# Patient Record
Sex: Female | Born: 1995 | Race: Black or African American | Hispanic: No | Marital: Single | Smoking: Never smoker
Health system: Southern US, Community
[De-identification: ages and names within clinical notes are randomized; demographics above are authoritative.]

## PROBLEM LIST (undated history)

## (undated) DIAGNOSIS — F419 Anxiety disorder, unspecified: Secondary | ICD-10-CM

## (undated) DIAGNOSIS — Z973 Presence of spectacles and contact lenses: Secondary | ICD-10-CM

## (undated) DIAGNOSIS — L659 Nonscarring hair loss, unspecified: Secondary | ICD-10-CM

## (undated) DIAGNOSIS — F32A Depression, unspecified: Secondary | ICD-10-CM

## (undated) DIAGNOSIS — F329 Major depressive disorder, single episode, unspecified: Secondary | ICD-10-CM

## (undated) HISTORY — PX: NO PAST SURGERIES: SHX2092

---
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
## 2017-02-19 IMAGING — CR DG CHEST 2V
2 series · 2 of 2 positions shown · non-contrast
Comparison: None.

CLINICAL DATA: 19-year-old female with palpitations and panic
attack.

EXAM:
CHEST  2 VIEW

[chest pa]
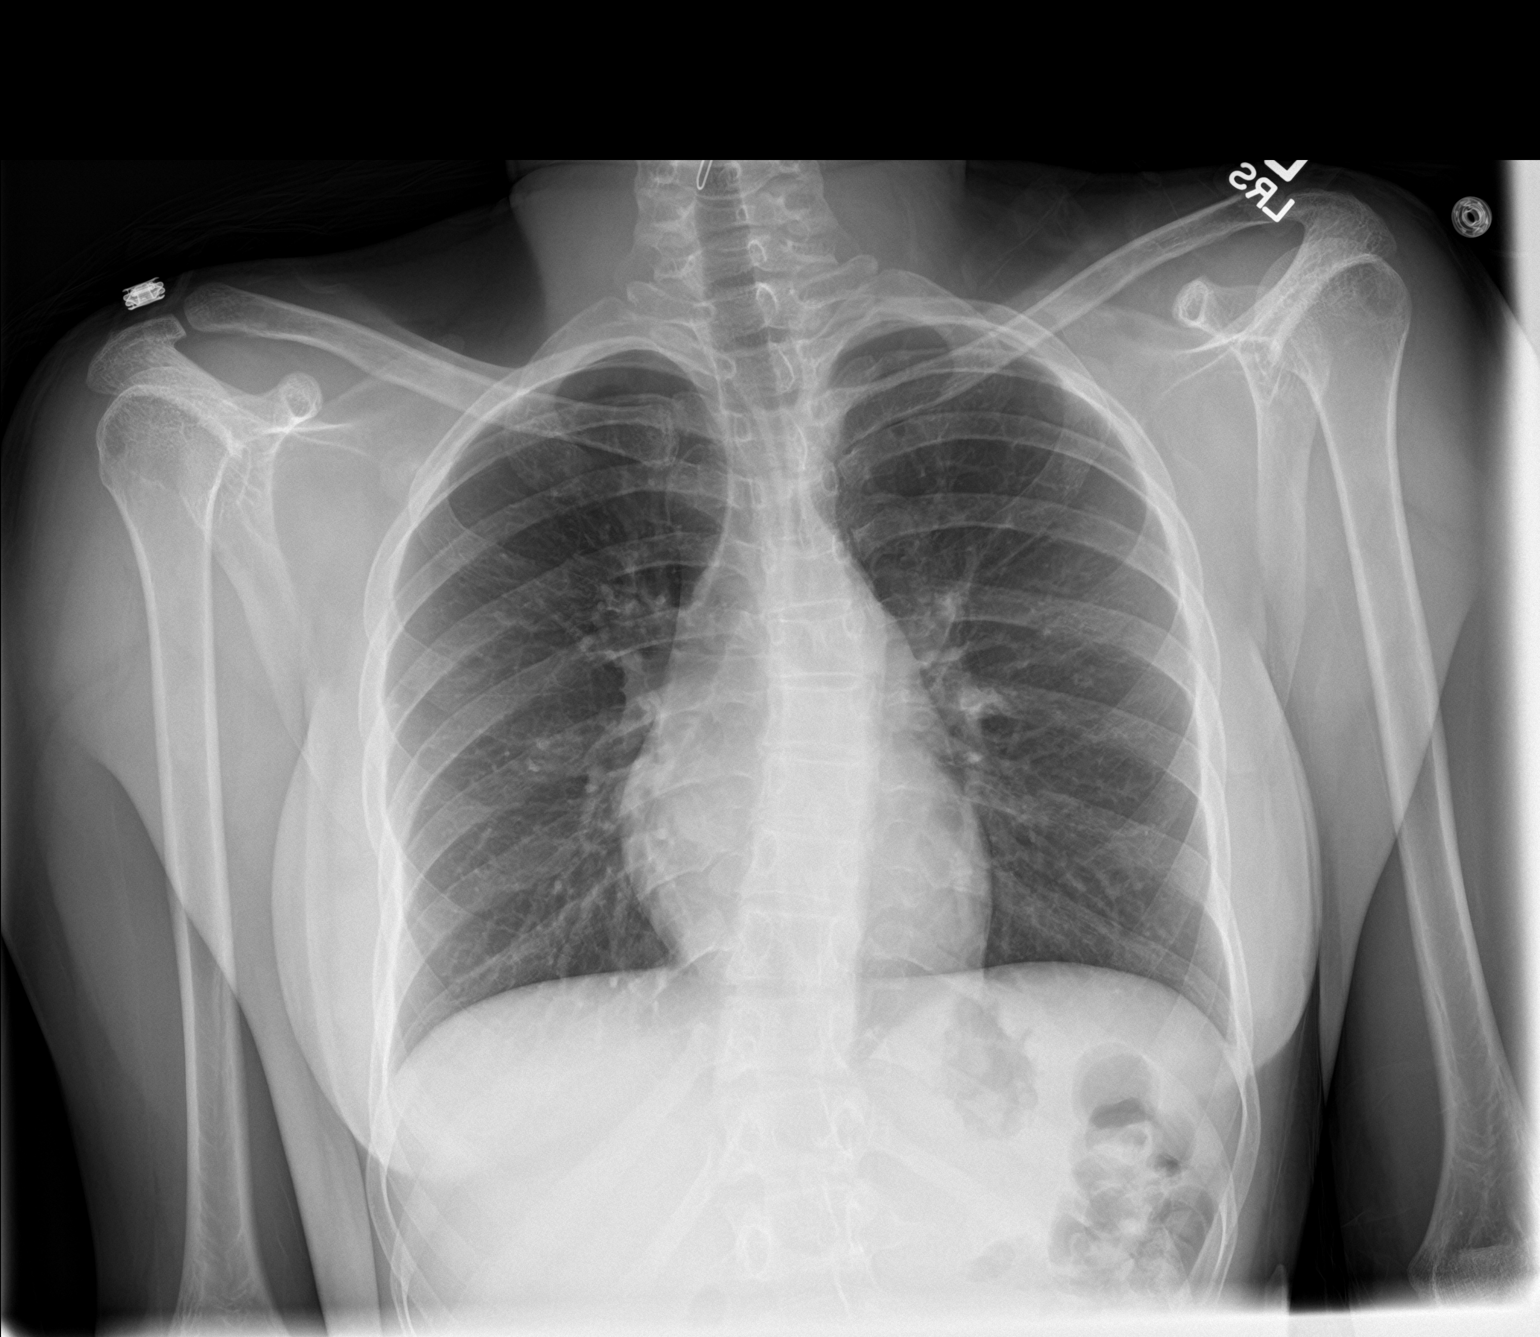

[chest lat]
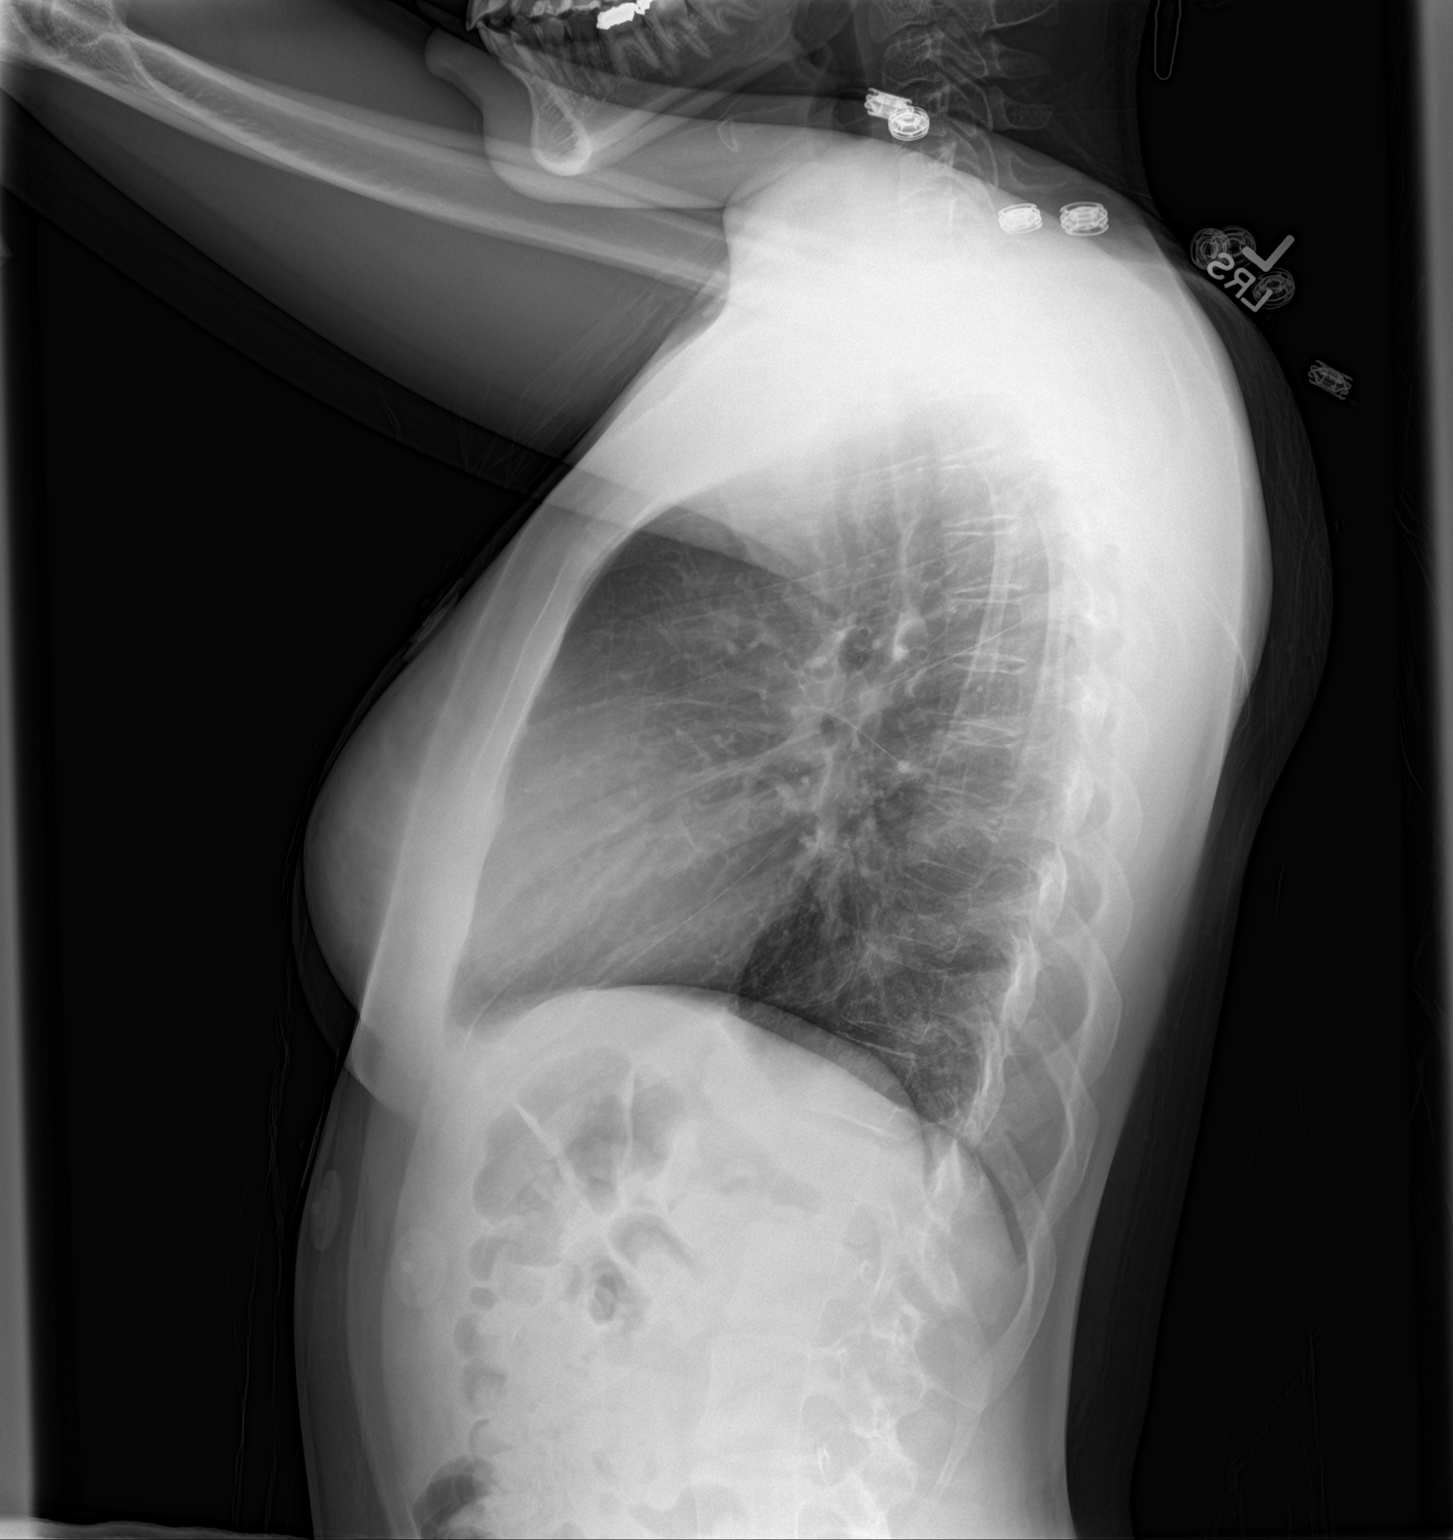

[2 of 2 positions shown; findings below may reference images not displayed]

FINDINGS: The heart size and mediastinal contours are within normal limits.
Both lungs are clear. The visualized skeletal structures are
unremarkable.
IMPRESSION: No active cardiopulmonary disease.

## 2019-07-05 ENCOUNTER — Encounter (HOSPITAL_COMMUNITY): Payer: Self-pay

## 2019-07-05 ENCOUNTER — Ambulatory Visit (HOSPITAL_COMMUNITY)
Admission: EM | Admit: 2019-07-05 | Discharge: 2019-07-05 | Disposition: A | Discharge status: Home / Self Care | Payer: HRSA Program | Attending: Urgent Care | Admitting: Urgent Care

## 2019-07-05 ENCOUNTER — Other Ambulatory Visit: Payer: Self-pay

## 2019-07-05 DIAGNOSIS — J069 Acute upper respiratory infection, unspecified: Secondary | ICD-10-CM | POA: Diagnosis present

## 2019-07-05 DIAGNOSIS — Z20822 Contact with and (suspected) exposure to covid-19: Secondary | ICD-10-CM

## 2019-07-05 DIAGNOSIS — U071 COVID-19: Secondary | ICD-10-CM | POA: Insufficient documentation

## 2019-07-05 MED ORDER — CETIRIZINE HCL 10 MG PO TABS: 10.0000 mg | ORAL_TABLET | Freq: Every day | ORAL | 0 refills | Status: DC

## 2019-07-05 MED ORDER — ONDANSETRON HCL 4 MG PO TABS: 4.0000 mg | ORAL_TABLET | Freq: Three times a day (TID) | ORAL | 0 refills | Status: DC | PRN

## 2019-07-05 MED ORDER — ACETAMINOPHEN 500 MG PO TABS: 500.0000 mg | ORAL_TABLET | Freq: Three times a day (TID) | ORAL | 0 refills | Status: DC | PRN

## 2019-07-05 NOTE — Discharge Instructions (Addendum)
Take the zyrtec Take the tylenol bodyache Take the zofran for nausea   If your Covid-19 test is positive, you will receive a phone call from [facility] regarding your results. Negative test results are not called. Both positive and negative results area always visible on MyChart. If you do not have a MyChart account, sign up instructions are in your discharge papers.   Persons who are directed to care for themselves at home may discontinue isolation under the following conditions:   At least 10 days have passed since symptom onset and  At least 24 hours have passed without running a fever (this means without the use of fever-reducing medications) and  Other symptoms have improved.  Persons infected with COVID-19 who never develop symptoms may discontinue isolation and other precautions 10 days after the date of their first positive COVID-19 test.

## 2019-07-05 NOTE — ED Triage Notes (Signed)
Pt presents to UC diarrhea, headache, chills, fever, body aches x 1 day.

## 2019-07-05 NOTE — ED Provider Notes (Signed)
[facility]    CSN: [id number] Arrival date & time: 07/05/19  1116      History   Chief Complaint Chief Complaint  Patient presents with  . Diarrhea  . Headache  . Fever  . Chills  . Generalized Body Aches    HPI Yvonne Carlson is a 24 y.o. female.   Patient reports urgent care for 1 day history of body ache, fever, chills and diarrhea.  She reports 3-4 loose stools since yesterday.  There has been no blood in her stools.  She is also endorsing nausea however denies vomiting.  She does have decreased appetite.  She has been tolerating liquids.  She has had subjective fever body ache and chills.  She also reports nasal congestion and a slight cough.  She also reports a mild sore throat.  Denies any ear pain.  Denies any shortness of breath or chest pain.  She is also reporting a frontal headache since the onset of symptoms.  Denies change in taste or smell.  Denies any painful urination, frequency urination or urgency urination.   Denies known sick contacts.      Past Medical History:  Diagnosis Date  . Anxiety   . Depression     There are no problems to display for this patient.   Past Surgical History:  Procedure Laterality Date  . NO PAST SURGERIES      OB History    Gravida  0   Para  0   Term  0   Preterm  0   AB  0   Living  0     SAB  0   TAB  0   Ectopic  0   Multiple  0   Live Births               Home Medications    Prior to Admission medications   Medication Sig Start Date End Date Taking? Authorizing Provider  acetaminophen (TYLENOL) 500 MG tablet Take 1 tablet (500 mg total) by mouth every 8 (eight) hours as needed for mild pain or headache. 07/05/19   Mickala Laton, Veryl Speak, PA-C  calcium carbonate (TUMS - DOSED IN MG ELEMENTAL CALCIUM) 500 MG chewable tablet Chew 2 tablets by mouth daily.    [provider]  cetirizine (ZYRTEC ALLERGY) 10 MG tablet Take 1 tablet (10 mg total) by mouth daily. 07/05/19 08/04/19  Katilin Raynes,  Veryl Speak, PA-C  ibuprofen (ADVIL,MOTRIN) 400 MG tablet Take 1 tablet (400 mg total) by mouth every 6 (six) hours as needed. 03/06/15   Antony Madura, PA-C  metoCLOPramide (REGLAN) 10 MG tablet Take 1 tablet (10 mg total) by mouth every 8 (eight) hours as needed (headache). 03/20/15   Tomasita Crumble, MD  omeprazole (PRILOSEC) 20 MG capsule Take 1 capsule (20 mg total) by mouth daily. 01/31/15   Marny Lowenstein, PA-C  ondansetron (ZOFRAN) 4 MG tablet Take 1 tablet (4 mg total) by mouth every 8 (eight) hours as needed for nausea or vomiting. 07/05/19   Ellisa Devivo, Veryl Speak, PA-C    Family History Family History  Problem Relation Age of Onset  . Hypertension Maternal Grandmother   . Diabetes Maternal Grandmother     Social History Social History   Tobacco Use  . Smoking status: Never Smoker  . Smokeless tobacco: Never Used  Substance Use Topics  . Alcohol use: No  . Drug use: No     Allergies   Patient has no known allergies.  Review of Systems Review of Systems  See HPI Physical Exam Triage Vital Signs ED Triage Vitals  Enc Vitals Group     BP 07/05/19 1148 119/70     Pulse Rate 07/05/19 1148 79     Resp 07/05/19 1148 17     Temp 07/05/19 1148 99.6 F (37.6 C)     Temp Source 07/05/19 1148 Oral     SpO2 07/05/19 1148 100 %     Weight --      Height --      Head Circumference --      Peak Flow --      Pain Score 07/05/19 1146 10     Pain Loc --      Pain Edu? --      Excl. in [place]? --    No data found.  Updated Vital Signs BP 119/70 (BP Location: Left Arm)   Pulse 79   Temp 99.6 F (37.6 C) (Oral)   Resp 17   LMP  (Within Months) Comment: 1 month  SpO2 100%   Visual Acuity Right Eye Distance:   Left Eye Distance:   Bilateral Distance:    Right Eye Near:   Left Eye Near:    Bilateral Near:     Physical Exam Vitals and nursing note reviewed.  Constitutional:      General: She is not in acute distress.    Appearance: She is well-developed. She is ill-appearing.    HENT:     Head: Normocephalic and atraumatic.     Nose:     Comments: Erythematous and swollen bilaterally.  There is some clear nasal discharge    Mouth/Throat:     Mouth: Mucous membranes are moist.     Pharynx: Oropharynx is clear.  Eyes:     Extraocular Movements: Extraocular movements intact.     Conjunctiva/sclera: Conjunctivae normal.     Pupils: Pupils are equal, round, and reactive to light.  Cardiovascular:     Rate and Rhythm: Normal rate and regular rhythm.     Heart sounds: No murmur.  Pulmonary:     Effort: Pulmonary effort is normal. No respiratory distress.     Breath sounds: Normal breath sounds.  Abdominal:     Palpations: Abdomen is soft.     Tenderness: There is no abdominal tenderness.  Musculoskeletal:     Cervical back: Neck supple.  Skin:    General: Skin is warm and dry.  Neurological:     Mental Status: She is alert.      UC Treatments / Results  Labs (all labs ordered are listed, but only abnormal results are displayed) Labs Reviewed  SARS CORONAVIRUS 2 (TAT 6-24 HRS) - Abnormal; Notable for the following components:      Result Value   SARS Coronavirus 2 POSITIVE (*)    All other components within normal limits    EKG   Radiology No results found.  Procedures Procedures (including critical care time)  Medications Ordered in UC Medications - No data to display  Initial Impression / Assessment and Plan / UC Course  I have reviewed the triage vital signs and the nursing notes.  Pertinent labs & imaging results that were available during my care of the patient were reviewed by me and considered in my medical decision making (see chart for details).     #Viral illness- COVID Patient 24 year old presenting with viral illness with upper respiratory and GI symptoms.  Covid PCR did return prior to note filing  and this was positive.  Patient is afebrile saturating well and hemodynamically well.  Will discharge with Zofran and  recommendation of Tylenol for headache and body ache these seem to be the biggest complaints.  Follow-up and return precautions were discussed with patient.  Final Clinical Impressions(s) / UC Diagnoses   Final diagnoses:  Viral upper respiratory tract infection  Encounter for laboratory testing for COVID-19 virus     Discharge Instructions     Take the zyrtec Take the tylenol bodyache Take the zofran for nausea   If your Covid-19 test is positive, you will receive a phone call from [facility] regarding your results. Negative test results are not called. Both positive and negative results area always visible on MyChart. If you do not have a MyChart account, sign up instructions are in your discharge papers.   Persons who are directed to care for themselves at home may discontinue isolation under the following conditions:  . At least 10 days have passed since symptom onset and . At least 24 hours have passed without running a fever (this means without the use of fever-reducing medications) and . Other symptoms have improved.  Persons infected with COVID-19 who never develop symptoms may discontinue isolation and other precautions 10 days after the date of their first positive COVID-19 test.       ED Prescriptions    Medication Sig Dispense Auth. Provider   cetirizine (ZYRTEC ALLERGY) 10 MG tablet Take 1 tablet (10 mg total) by mouth daily. 30 tablet Alhaji Mcneal, Veryl Speak, PA-C   acetaminophen (TYLENOL) 500 MG tablet Take 1 tablet (500 mg total) by mouth every 8 (eight) hours as needed for mild pain or headache. 30 tablet Antigone Crowell, Veryl Speak, PA-C   ondansetron (ZOFRAN) 4 MG tablet Take 1 tablet (4 mg total) by mouth every 8 (eight) hours as needed for nausea or vomiting. 4 tablet Tarae Wooden, Veryl Speak, PA-C     PDMP not reviewed this encounter.   Hermelinda Medicus, PA-C 07/06/19 [phone]

## 2019-07-06 LAB — SARS CORONAVIRUS 2 (TAT 6-24 HRS): SARS Coronavirus 2: POSITIVE — AB

## 2019-09-21 ENCOUNTER — Ambulatory Visit (HOSPITAL_COMMUNITY)
Admission: EM | Admit: 2019-09-21 | Discharge: 2019-09-21 | Disposition: A | Discharge status: Home / Self Care | Payer: BC Managed Care – PPO | Attending: Emergency Medicine | Admitting: Emergency Medicine

## 2019-09-21 ENCOUNTER — Other Ambulatory Visit: Payer: Self-pay

## 2019-09-21 ENCOUNTER — Encounter (HOSPITAL_COMMUNITY): Payer: Self-pay

## 2019-09-21 DIAGNOSIS — Z79899 Other long term (current) drug therapy: Secondary | ICD-10-CM | POA: Diagnosis not present

## 2019-09-21 DIAGNOSIS — N760 Acute vaginitis: Secondary | ICD-10-CM | POA: Insufficient documentation

## 2019-09-21 DIAGNOSIS — Z3202 Encounter for pregnancy test, result negative: Secondary | ICD-10-CM | POA: Diagnosis not present

## 2019-09-21 DIAGNOSIS — R350 Frequency of micturition: Secondary | ICD-10-CM | POA: Diagnosis not present

## 2019-09-21 LAB — POCT URINALYSIS DIP (DEVICE)
Bilirubin Urine: NEGATIVE
Glucose, UA: NEGATIVE mg/dL
Hgb urine dipstick: NEGATIVE
Ketones, ur: NEGATIVE mg/dL
Leukocytes,Ua: NEGATIVE
Nitrite: NEGATIVE
Protein, ur: NEGATIVE mg/dL
Specific Gravity, Urine: 1.025 (ref 1.005–1.030)
Urobilinogen, UA: 0.2 mg/dL (ref 0.0–1.0)
pH: 7 (ref 5.0–8.0)

## 2019-09-21 LAB — POC URINE PREG, ED: Preg Test, Ur: NEGATIVE

## 2019-09-21 MED ORDER — FLUCONAZOLE 150 MG PO TABS: 150.0000 mg | ORAL_TABLET | Freq: Once | ORAL | 0 refills | Status: AC

## 2019-09-21 NOTE — Discharge Instructions (Signed)
Please take 1 tablet of Diflucan today, repeat in 3 to 4 days if positive for yeast and still having symptoms  We are testing you for Gonorrhea, Chlamydia, Trichomonas, Yeast and Bacterial Vaginosis. We will call you if anything is positive and let you know if you require any further treatment. Please inform partners of any positive results.   Please return if symptoms not improving with treatment, development of fever, nausea, vomiting, abdominal pain.

## 2019-09-21 NOTE — ED Triage Notes (Signed)
Pt presents with vaginal discharge and urinary frequency X 3 days.

## 2019-09-22 NOTE — ED Provider Notes (Signed)
[facility]    CSN: [id number] Arrival date & time: 09/21/19  1509      History   Chief Complaint Chief Complaint  Patient presents with  . Vaginitis    HPI Yvonne Carlson is a 24 y.o. female presenting today for evaluation of vaginal discharge.  Patient reports that over the past 3 days she has had vaginal discharge, urinary frequency along with some dysuria and incomplete voiding.  She has had some irritation and burning externally and feels as if her labia is swollen.  She does report recently taking metronidazole which she had leftover from a prior BV prescription in hopes that this would treat chlamydia as she had possible exposure to chlamydia.  She denies any fevers nausea or vomiting.  Last menstrual cycle was middle of May, is not on birth control.  HPI  Past Medical History:  Diagnosis Date  . Anxiety   . Depression     There are no problems to display for this patient.   Past Surgical History:  Procedure Laterality Date  . NO PAST SURGERIES      OB History    Gravida  0   Para  0   Term  0   Preterm  0   AB  0   Living  0     SAB  0   TAB  0   Ectopic  0   Multiple  0   Live Births               Home Medications    Prior to Admission medications   Medication Sig Start Date End Date Taking? Authorizing Provider  acetaminophen (TYLENOL) 500 MG tablet Take 1 tablet (500 mg total) by mouth every 8 (eight) hours as needed for mild pain or headache. 07/05/19   Darr, Veryl Speak, PA-C  calcium carbonate (TUMS - DOSED IN MG ELEMENTAL CALCIUM) 500 MG chewable tablet Chew 2 tablets by mouth daily.    [provider]  cetirizine (ZYRTEC ALLERGY) 10 MG tablet Take 1 tablet (10 mg total) by mouth daily. 07/05/19 08/04/19  Darr, Veryl Speak, PA-C  ibuprofen (ADVIL,MOTRIN) 400 MG tablet Take 1 tablet (400 mg total) by mouth every 6 (six) hours as needed. 03/06/15   Antony Madura, PA-C  metoCLOPramide (REGLAN) 10 MG tablet Take 1 tablet (10 mg  total) by mouth every 8 (eight) hours as needed (headache). 03/20/15   Tomasita Crumble, MD  omeprazole (PRILOSEC) 20 MG capsule Take 1 capsule (20 mg total) by mouth daily. 01/31/15   Marny Lowenstein, PA-C  ondansetron (ZOFRAN) 4 MG tablet Take 1 tablet (4 mg total) by mouth every 8 (eight) hours as needed for nausea or vomiting. 07/05/19   Darr, Veryl Speak, PA-C    Family History Family History  Problem Relation Age of Onset  . Hypertension Maternal Grandmother   . Diabetes Maternal Grandmother     Social History Social History   Tobacco Use  . Smoking status: Never Smoker  . Smokeless tobacco: Never Used  Substance Use Topics  . Alcohol use: No  . Drug use: No     Allergies   Patient has no known allergies.   Review of Systems Review of Systems  Constitutional: Negative for fever.  Respiratory: Negative for shortness of breath.   Cardiovascular: Negative for chest pain.  Gastrointestinal: Negative for abdominal pain, diarrhea, nausea and vomiting.  Genitourinary: Positive for dysuria, frequency and vaginal discharge. Negative for flank pain, genital sores, hematuria,  menstrual problem, vaginal bleeding and vaginal pain.  Musculoskeletal: Negative for back pain.  Skin: Negative for rash.  Neurological: Negative for dizziness, light-headedness and headaches.     Physical Exam Triage Vital Signs ED Triage Vitals  Enc Vitals Group     BP 09/21/19 1532 117/60     Pulse Rate 09/21/19 1532 83     Resp 09/21/19 1532 18     Temp 09/21/19 1532 98.6 F (37 C)     Temp Source 09/21/19 1532 Oral     SpO2 09/21/19 1532 100 %     Weight --      Height --      Head Circumference --      Peak Flow --      Pain Score 09/21/19 1533 2     Pain Loc --      Pain Edu? --      Excl. in [place]? --    No data found.  Updated Vital Signs BP 117/60 (BP Location: Right Arm)   Pulse 83   Temp 98.6 F (37 C) (Oral)   Resp 18   LMP  (LMP Unknown)   SpO2 100%   Visual Acuity Right Eye  Distance:   Left Eye Distance:   Bilateral Distance:    Right Eye Near:   Left Eye Near:    Bilateral Near:     Physical Exam Vitals and nursing note reviewed.  Constitutional:      Appearance: She is well-developed.     Comments: No acute distress  HENT:     Head: Normocephalic and atraumatic.     Nose: Nose normal.  Eyes:     Conjunctiva/sclera: Conjunctivae normal.  Cardiovascular:     Rate and Rhythm: Normal rate.  Pulmonary:     Effort: Pulmonary effort is normal. No respiratory distress.  Abdominal:     General: There is no distension.     Comments: Soft, nondistended, nontender to light and to palpation throughout abdomen  Genitourinary:    Comments: Vulva appears more erythematous in various areas with small cut/open lesions, non or ulcerative and nonvesicular blister appearing Musculoskeletal:        General: Normal range of motion.     Cervical back: Neck supple.  Skin:    General: Skin is warm and dry.  Neurological:     Mental Status: She is alert and oriented to person, place, and time.      UC Treatments / Results  Labs (all labs ordered are listed, but only abnormal results are displayed) Labs Reviewed  POC URINE PREG, ED  POCT URINALYSIS DIP (DEVICE)  CERVICOVAGINAL ANCILLARY ONLY    EKG   Radiology No results found.  Procedures Procedures (including critical care time)  Medications Ordered in UC Medications - No data to display  Initial Impression / Assessment and Plan / UC Course  I have reviewed the triage vital signs and the nursing notes.  Pertinent labs & imaging results that were available during my care of the patient were reviewed by me and considered in my medical decision making (see chart for details).     Exam and history suggestive of most likely yeast given associated small cuts and started after taking antibiotics.  Empirically treating with Diflucan.  UA unremarkable with negative pregnancy.  Vaginal swab pending to  further evaluate for vaginitis/STDs.  Will call with results and alter treatment as needed.  Discussed strict return precautions. Patient verbalized understanding and is agreeable with plan.  Final  Clinical Impressions(s) / UC Diagnoses   Final diagnoses:  Vaginitis and vulvovaginitis     Discharge Instructions     Please take 1 tablet of Diflucan today, repeat in 3 to 4 days if positive for yeast and still having symptoms  We are testing you for Gonorrhea, Chlamydia, Trichomonas, Yeast and Bacterial Vaginosis. We will call you if anything is positive and let you know if you require any further treatment. Please inform partners of any positive results.   Please return if symptoms not improving with treatment, development of fever, nausea, vomiting, abdominal pain.    ED Prescriptions    Medication Sig Dispense Auth. Provider   fluconazole (DIFLUCAN) 150 MG tablet Take 1 tablet (150 mg total) by mouth once for 1 dose. 2 tablet Jeovani Weisenburger, [place] C, PA-C     PDMP not reviewed this encounter.   Medha Pippen, [place] C, PA-C 09/22/19 1150

## 2019-09-23 LAB — CERVICOVAGINAL ANCILLARY ONLY
Bacterial Vaginitis (gardnerella): NEGATIVE
Candida Glabrata: NEGATIVE
Candida Vaginitis: POSITIVE — AB
Chlamydia: NEGATIVE
Comment: NEGATIVE
Comment: NEGATIVE
Comment: NEGATIVE
Comment: NEGATIVE
Comment: NEGATIVE
Comment: NORMAL
Neisseria Gonorrhea: NEGATIVE
Trichomonas: NEGATIVE

## 2019-10-16 ENCOUNTER — Ambulatory Visit: Payer: Self-pay | Admitting: Dermatology

## 2020-11-11 ENCOUNTER — Ambulatory Visit (HOSPITAL_COMMUNITY)
Admission: EM | Admit: 2020-11-11 | Discharge: 2020-11-11 | Disposition: A | Discharge status: Home / Self Care | Payer: Self-pay | Attending: Emergency Medicine | Admitting: Emergency Medicine

## 2020-11-11 ENCOUNTER — Encounter (HOSPITAL_COMMUNITY): Payer: Self-pay

## 2020-11-11 ENCOUNTER — Other Ambulatory Visit: Payer: Self-pay

## 2020-11-11 DIAGNOSIS — Z7952 Long term (current) use of systemic steroids: Secondary | ICD-10-CM | POA: Insufficient documentation

## 2020-11-11 DIAGNOSIS — R0989 Other specified symptoms and signs involving the circulatory and respiratory systems: Secondary | ICD-10-CM | POA: Insufficient documentation

## 2020-11-11 DIAGNOSIS — R059 Cough, unspecified: Secondary | ICD-10-CM | POA: Insufficient documentation

## 2020-11-11 DIAGNOSIS — J3489 Other specified disorders of nose and nasal sinuses: Secondary | ICD-10-CM | POA: Insufficient documentation

## 2020-11-11 DIAGNOSIS — U071 COVID-19: Secondary | ICD-10-CM | POA: Insufficient documentation

## 2020-11-11 LAB — SARS CORONAVIRUS 2 (TAT 6-24 HRS): SARS Coronavirus 2: POSITIVE — AB

## 2020-11-11 MED ORDER — PREDNISONE 10 MG (21) PO TBPK
ORAL_TABLET | Freq: Every day | ORAL | 0 refills | Status: DC
Start: 2020-11-11 — End: 2023-09-06

## 2020-11-11 MED ORDER — ALBUTEROL SULFATE HFA 108 (90 BASE) MCG/ACT IN AERS: 1.0000 | INHALATION_SPRAY | Freq: Four times a day (QID) | RESPIRATORY_TRACT | 0 refills | Status: DC | PRN

## 2020-11-11 NOTE — ED Triage Notes (Signed)
Pt reports cough x 3 days.  

## 2020-11-11 NOTE — ED Provider Notes (Signed)
[facility]    CSN: [id number] Arrival date & time: 11/11/20  0856      History   Chief Complaint Chief Complaint  Patient presents with   Cough    HPI Yvonne Carlson is a 25 y.o. female.   Pt is here to have a covid test. She has a cough, runny nose, and bodyaches for the past 3 days. States she has not taken anything for this. Unsure she is has been around anyone else that was sick.    Past Medical History:  Diagnosis Date   Anxiety    Depression     There are no problems to display for this patient.   Past Surgical History:  Procedure Laterality Date   NO PAST SURGERIES      OB History     Gravida  0   Para  0   Term  0   Preterm  0   AB  0   Living  0      SAB  0   IAB  0   Ectopic  0   Multiple  0   Live Births               Home Medications    Prior to Admission medications   Medication Sig Start Date End Date Taking? Authorizing Provider  albuterol (VENTOLIN HFA) 108 (90 Base) MCG/ACT inhaler Inhale 1-2 puffs into the lungs every 6 (six) hours as needed for wheezing or shortness of breath. 11/11/20  Yes Coralyn Mark, NP  predniSONE (STERAPRED UNI-PAK 21 TAB) 10 MG (21) TBPK tablet Take by mouth daily. Take 6 tabs by mouth daily  for 2 days, then 5 tabs for 2 days, then 4 tabs for 2 days, then 3 tabs for 2 days, 2 tabs for 2 days, then 1 tab by mouth daily for 2 days 11/11/20  Yes Coralyn Mark, NP  acetaminophen (TYLENOL) 500 MG tablet Take 1 tablet (500 mg total) by mouth every 8 (eight) hours as needed for mild pain or headache. 07/05/19   Darr, Gerilyn Pilgrim, PA-C  calcium carbonate (TUMS - DOSED IN MG ELEMENTAL CALCIUM) 500 MG chewable tablet Chew 2 tablets by mouth daily.    [provider]  cetirizine (ZYRTEC ALLERGY) 10 MG tablet Take 1 tablet (10 mg total) by mouth daily. 07/05/19 08/04/19  Darr, Gerilyn Pilgrim, PA-C  ibuprofen (ADVIL,MOTRIN) 400 MG tablet Take 1 tablet (400 mg total) by mouth every 6 (six) hours as  needed. 03/06/15   Antony Madura, PA-C  metoCLOPramide (REGLAN) 10 MG tablet Take 1 tablet (10 mg total) by mouth every 8 (eight) hours as needed (headache). 03/20/15   Tomasita Crumble, MD  omeprazole (PRILOSEC) 20 MG capsule Take 1 capsule (20 mg total) by mouth daily. 01/31/15   Marny Lowenstein, PA-C  ondansetron (ZOFRAN) 4 MG tablet Take 1 tablet (4 mg total) by mouth every 8 (eight) hours as needed for nausea or vomiting. 07/05/19   Darr, Gerilyn Pilgrim, PA-C    Family History Family History  Problem Relation Age of Onset   Hypertension Maternal Grandmother    Diabetes Maternal Grandmother     Social History Social History   Tobacco Use   Smoking status: Never   Smokeless tobacco: Never  Substance Use Topics   Alcohol use: No   Drug use: No     Allergies   Patient has no known allergies.   Review of Systems Review of Systems  Constitutional:  Positive for activity  change.  HENT:  Positive for congestion, postnasal drip, rhinorrhea, sinus pain and sneezing.   Eyes: Negative.   Respiratory:  Positive for cough. Negative for shortness of breath.   Cardiovascular: Negative.   Gastrointestinal: Negative.   Genitourinary: Negative.   Skin: Negative.   Neurological: Negative.     Physical Exam Triage Vital Signs ED Triage Vitals  Enc Vitals Group     BP 11/11/20 0936 116/76     Pulse Rate 11/11/20 0936 85     Resp 11/11/20 0936 16     Temp 11/11/20 0936 98.3 F (36.8 C)     Temp Source 11/11/20 0936 Oral     SpO2 11/11/20 0936 98 %     Weight --      Height --      Head Circumference --      Peak Flow --      Pain Score 11/11/20 0935 0     Pain Loc --      Pain Edu? --      Excl. in [place]? --    No data found.  Updated Vital Signs BP 116/76 (BP Location: Left Arm)   Pulse 85   Temp 98.3 F (36.8 C) (Oral)   Resp 16   SpO2 98%   Visual Acuity Right Eye Distance:   Left Eye Distance:   Bilateral Distance:    Right Eye Near:   Left Eye Near:    Bilateral Near:      Physical Exam Constitutional:      Appearance: Normal appearance.  HENT:     Right Ear: Tympanic membrane normal.     Left Ear: Tympanic membrane normal.     Nose: Congestion present.     Mouth/Throat:     Mouth: Mucous membranes are moist.  Eyes:     Pupils: Pupils are equal, round, and reactive to light.  Cardiovascular:     Rate and Rhythm: Normal rate.  Pulmonary:     Effort: Pulmonary effort is normal.     Breath sounds: Normal breath sounds.  Abdominal:     General: Abdomen is flat.  Musculoskeletal:     Cervical back: Normal range of motion.  Neurological:     General: No focal deficit present.     Mental Status: She is alert.     UC Treatments / Results  Labs (all labs ordered are listed, but only abnormal results are displayed) Labs Reviewed  SARS CORONAVIRUS 2 (TAT 6-24 HRS)    EKG   Radiology No results found.  Procedures Procedures (including critical care time)  Medications Ordered in UC Medications - No data to display  Initial Impression / Assessment and Plan / UC Course  I have reviewed the triage vital signs and the nursing notes.  Pertinent labs & imaging results that were available during my care of the patient were reviewed by me and considered in my medical decision making (see chart for details).     Will send off covid test it may take upto 3 days to return check my chart for results  Stay hydrated well  Take inhaler as needed for sob  The cough can linger for several days  Take tylenol or motrin as needed for pain   Final Clinical Impressions(s) / UC Diagnoses   Final diagnoses:  Cough  Stuffy and runny nose     Discharge Instructions      Will send off covid test it may take upto 3 days to return check  my chart for results  Stay hydrated well  Take inhaler as needed for sob  The cough can linger for several days  Take tylenol or motrin as needed for pain      ED Prescriptions     Medication Sig Dispense Auth.  Provider   albuterol (VENTOLIN HFA) 108 (90 Base) MCG/ACT inhaler Inhale 1-2 puffs into the lungs every 6 (six) hours as needed for wheezing or shortness of breath. 90 g Maple Mirza L, NP   predniSONE (STERAPRED UNI-PAK 21 TAB) 10 MG (21) TBPK tablet Take by mouth daily. Take 6 tabs by mouth daily  for 2 days, then 5 tabs for 2 days, then 4 tabs for 2 days, then 3 tabs for 2 days, 2 tabs for 2 days, then 1 tab by mouth daily for 2 days 42 tablet Coralyn Mark, NP      PDMP not reviewed this encounter.   Coralyn Mark, NP 11/11/20 [phone]

## 2020-11-11 NOTE — Discharge Instructions (Addendum)
Will send off covid test it may take upto 3 days to return check my chart for results  Stay hydrated well  Take inhaler as needed for sob  The cough can linger for several days  Take tylenol or motrin as needed for pain

## 2021-03-25 ENCOUNTER — Ambulatory Visit (HOSPITAL_COMMUNITY)
Admission: EM | Admit: 2021-03-25 | Discharge: 2021-03-25 | Disposition: A | Discharge status: Home / Self Care | Payer: Self-pay | Attending: Family Medicine | Admitting: Family Medicine

## 2021-03-25 ENCOUNTER — Other Ambulatory Visit: Payer: Self-pay

## 2021-03-25 DIAGNOSIS — R42 Dizziness and giddiness: Secondary | ICD-10-CM

## 2021-03-25 DIAGNOSIS — R110 Nausea: Secondary | ICD-10-CM

## 2021-03-25 LAB — POC URINE PREG, ED: Preg Test, Ur: NEGATIVE

## 2021-03-25 NOTE — ED Triage Notes (Signed)
Pt presents to the office for urine pregnancy. Pt reports having dizzy spells that started today.

## 2021-03-25 NOTE — ED Provider Notes (Signed)
[facility]    CSN: [id number] Arrival date & time: 03/25/21  1809      History   Chief Complaint Chief Complaint  Patient presents with   Dizziness    Pt is requesting a UPT.    HPI Yvonne Carlson is a 25 y.o. female.    Dizziness Here for dizziness and nausea (not vertigo) that happened while about to eat at a restaurant. It lasted only a few seconds. Now is hungry. No abd pain, v/d,f/c, URI symptoms.  LMP last month, states is irregular.    Past Medical History:  Diagnosis Date   Anxiety    Depression     There are no problems to display for this patient.   Past Surgical History:  Procedure Laterality Date   NO PAST SURGERIES      OB History     Gravida  0   Para  0   Term  0   Preterm  0   AB  0   Living  0      SAB  0   IAB  0   Ectopic  0   Multiple  0   Live Births               Home Medications    Prior to Admission medications   Medication Sig Start Date End Date Taking? Authorizing Provider  acetaminophen (TYLENOL) 500 MG tablet Take 1 tablet (500 mg total) by mouth every 8 (eight) hours as needed for mild pain or headache. 07/05/19   Darr, Gerilyn Pilgrim, PA-C  albuterol (VENTOLIN HFA) 108 (90 Base) MCG/ACT inhaler Inhale 1-2 puffs into the lungs every 6 (six) hours as needed for wheezing or shortness of breath. 11/11/20   Coralyn Mark, NP  calcium carbonate (TUMS - DOSED IN MG ELEMENTAL CALCIUM) 500 MG chewable tablet Chew 2 tablets by mouth daily.    [provider]  cetirizine (ZYRTEC ALLERGY) 10 MG tablet Take 1 tablet (10 mg total) by mouth daily. 07/05/19 08/04/19  Darr, Gerilyn Pilgrim, PA-C  ibuprofen (ADVIL,MOTRIN) 400 MG tablet Take 1 tablet (400 mg total) by mouth every 6 (six) hours as needed. 03/06/15   Antony Madura, PA-C  metoCLOPramide (REGLAN) 10 MG tablet Take 1 tablet (10 mg total) by mouth every 8 (eight) hours as needed (headache). 03/20/15   Tomasita Crumble, MD  omeprazole (PRILOSEC) 20 MG capsule Take 1  capsule (20 mg total) by mouth daily. 01/31/15   Marny Lowenstein, PA-C  ondansetron (ZOFRAN) 4 MG tablet Take 1 tablet (4 mg total) by mouth every 8 (eight) hours as needed for nausea or vomiting. 07/05/19   Darr, Gerilyn Pilgrim, PA-C  predniSONE (STERAPRED UNI-PAK 21 TAB) 10 MG (21) TBPK tablet Take by mouth daily. Take 6 tabs by mouth daily  for 2 days, then 5 tabs for 2 days, then 4 tabs for 2 days, then 3 tabs for 2 days, 2 tabs for 2 days, then 1 tab by mouth daily for 2 days 11/11/20   Coralyn Mark, NP    Family History Family History  Problem Relation Age of Onset   Hypertension Maternal Grandmother    Diabetes Maternal Grandmother     Social History Social History   Tobacco Use   Smoking status: Never   Smokeless tobacco: Never  Substance Use Topics   Alcohol use: No   Drug use: No     Allergies   Patient has no known allergies.   Review of Systems Review  of Systems  Neurological:  Positive for dizziness.    Physical Exam Triage Vital Signs ED Triage Vitals [03/25/21 1912]  Enc Vitals Group     BP 126/76     Pulse Rate 73     Resp 16     Temp (!) 97.3 F (36.3 C)     Temp Source Oral     SpO2 100 %     Weight      Height      Head Circumference      Peak Flow      Pain Score      Pain Loc      Pain Edu?      Excl. in GC?    No data found.  Updated Vital Signs BP 126/76 (BP Location: Left Arm)    Pulse 73    Temp (!) 97.3 F (36.3 C) (Oral)    Resp 16    LMP 01/26/2021 (Approximate)    SpO2 100%   Visual Acuity Right Eye Distance:   Left Eye Distance:   Bilateral Distance:    Right Eye Near:   Left Eye Near:    Bilateral Near:     Physical Exam Vitals reviewed.  HENT:     Mouth/Throat:     Mouth: Mucous membranes are moist.     Pharynx: No oropharyngeal exudate or posterior oropharyngeal erythema.  Cardiovascular:     Rate and Rhythm: Normal rate and regular rhythm.     Heart sounds: No murmur heard. Pulmonary:     Effort: Pulmonary  effort is normal.     Breath sounds: Normal breath sounds.  Abdominal:     Palpations: Abdomen is soft.  Skin:    Capillary Refill: Capillary refill takes less than 2 seconds.     Coloration: Skin is not jaundiced or pale.  Neurological:     Mental Status: She is alert and oriented to person, place, and time.  Psychiatric:        Behavior: Behavior normal.     UC Treatments / Results  Labs (all labs ordered are listed, but only abnormal results are displayed) Labs Reviewed  POC URINE PREG, ED    EKG   Radiology No results found.  Procedures Procedures (including critical care time)  Medications Ordered in UC Medications - No data to display  Initial Impression / Assessment and Plan / UC Course  I have reviewed the triage vital signs and the nursing notes.  Pertinent labs & imaging results that were available during my care of the patient were reviewed by me and considered in my medical decision making (see chart for details).     UPT neg, discussed may need repeating if symptoms persist.  Final Clinical Impressions(s) / UC Diagnoses   Final diagnoses:  Dizziness  Nausea     Discharge Instructions      Pregnancy test is negative today. It may need repeating if your symptoms continue.     ED Prescriptions   None    PDMP not reviewed this encounter.   Zenia Resides, MD 03/25/21 [phone]

## 2021-03-25 NOTE — Discharge Instructions (Addendum)
Pregnancy test is negative today. It may need repeating if your symptoms continue.

## 2021-06-02 ENCOUNTER — Ambulatory Visit (INDEPENDENT_AMBULATORY_CARE_PROVIDER_SITE_OTHER): Payer: Self-pay | Admitting: Family Medicine

## 2021-06-02 ENCOUNTER — Encounter: Payer: Self-pay | Admitting: Family Medicine

## 2021-06-02 ENCOUNTER — Other Ambulatory Visit: Payer: Self-pay

## 2021-06-02 VITALS — BP 119/69 | HR 72 | Temp 98.2°F | Resp 16 | Ht <= 58 in | Wt 113.6 lb

## 2021-06-02 DIAGNOSIS — Z7689 Persons encountering health services in other specified circumstances: Secondary | ICD-10-CM

## 2021-06-02 DIAGNOSIS — L659 Nonscarring hair loss, unspecified: Secondary | ICD-10-CM

## 2021-06-02 MED ORDER — MINOXIDIL FOR WOMEN 2 % EX SOLN: Freq: Two times a day (BID) | CUTANEOUS | 3 refills | Status: DC

## 2021-06-02 MED ORDER — TRIAMCINOLONE ACETONIDE 0.5 % EX CREA: 1.0000 "application " | TOPICAL_CREAM | Freq: Two times a day (BID) | CUTANEOUS | 3 refills | Status: DC

## 2021-06-02 NOTE — Progress Notes (Signed)
Patient is here to establish care with provider ? Patient is concern about having a bald spot in her head that has been present for 1 year. Patient said that the area sometimes hurt when she is getting her hair done ?

## 2021-06-03 ENCOUNTER — Encounter: Payer: Self-pay | Admitting: Family Medicine

## 2021-06-03 NOTE — Progress Notes (Signed)
? ?New Patient Office Visit ? ?Subjective:  ?Patient ID: Yvonne Carlson, female    DOB: [birth year]  Age: 26 y.o. MRN: [id number] ? ?CC:  ?Chief Complaint  ?Patient presents with  ? Establish Care  ? Hair/Scalp Problem  ? ? ?HPI ?Yvonne Carlson presents for to establish care and for complaint of hair loss. She reports that the hair loss is in an area where she had been having braids and has gotten bigger.  ? ?Past Medical History:  ?Diagnosis Date  ? Anxiety   ? Depression   ? ? ?Past Surgical History:  ?Procedure Laterality Date  ? NO PAST SURGERIES    ? ? ?Family History  ?Problem Relation Age of Onset  ? Hypertension Maternal Grandmother   ? Diabetes Maternal Grandmother   ? ? ?Social History  ? ?Socioeconomic History  ? Marital status: Single  ?  Spouse name: Not on file  ? Number of children: Not on file  ? Years of education: Not on file  ? Highest education level: Not on file  ?Occupational History  ? Not on file  ?Tobacco Use  ? Smoking status: Never  ? Smokeless tobacco: Never  ?Vaping Use  ? Vaping Use: Never used  ?Substance and Sexual Activity  ? Alcohol use: No  ? Drug use: No  ? Sexual activity: Yes  ?  Birth control/protection: None  ?Other Topics Concern  ? Not on file  ?Social History Narrative  ? Not on file  ? ?Social Determinants of Health  ? ?Financial Resource Strain: Not on file  ?Food Insecurity: Not on file  ?Transportation Needs: Not on file  ?Physical Activity: Not on file  ?Stress: Not on file  ?Social Connections: Not on file  ?Intimate Partner Violence: Not on file  ? ? ?ROS ?Review of Systems  ?All other systems reviewed and are negative. ? ?Objective:  ? ?Today's Vitals: BP 119/69 (BP Location: Right Arm, Patient Position: Sitting, Cuff Size: Small)   Pulse 72   Temp 98.2 ?F (36.8 ?C) (Oral)   Resp 16   Ht 4\' 9"  (1.448 m)   Wt 113 lb 9.6 oz (51.5 kg)   SpO2 96%   BMI 24.58 kg/m?  ? ?Physical Exam ?Vitals and nursing note reviewed.  ?Constitutional:   ?   General: She is not in  acute distress. ?HENT:  ?   Head:  ? ?Cardiovascular:  ?   Rate and Rhythm: Normal rate and regular rhythm.  ?Pulmonary:  ?   Effort: Pulmonary effort is normal.  ?   Breath sounds: Normal breath sounds.  ?Neurological:  ?   General: No focal deficit present.  ?   Mental Status: She is alert and oriented to person, place, and time.  ? ? ?Assessment & Plan:  ?1. 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 Discussed options in detail with patient. She defers labs at this time. She is referred to derm for further eval/mgt. Steroid cream and minoxidil prescribed.  ? ?- Ambulatory referral to Dermatology ? ?2. Encounter to establish care ? ? ?Problem List Items Addressed This Visit   ?None ?Visit Diagnoses   ? ? Alopecia    -  Primary  ? Relevant Orders  ? Ambulatory referral to Dermatology  ? ?  ? ? ?Outpatient Encounter Medications as of 06/02/2021  ?Medication Sig  ? acetaminophen (TYLENOL) 500 MG tablet Take 1 tablet (500 mg total) by mouth every 8 (eight) hours as needed for mild pain or headache.  ?  albuterol (VENTOLIN HFA) 108 (90 Base) MCG/ACT inhaler Inhale 1-2 puffs into the lungs every 6 (six) hours as needed for wheezing or shortness of breath.  ? calcium carbonate (TUMS - DOSED IN MG ELEMENTAL CALCIUM) 500 MG chewable tablet Chew 2 tablets by mouth daily.  ? ibuprofen (ADVIL,MOTRIN) 400 MG tablet Take 1 tablet (400 mg total) by mouth every 6 (six) hours as needed.  ? metoCLOPramide (REGLAN) 10 MG tablet Take 1 tablet (10 mg total) by mouth every 8 (eight) hours as needed (headache).  ? minoxidil (MINOXIDIL FOR WOMEN) 2 % external solution Apply topically 2 (two) times daily.  ? omeprazole (PRILOSEC) 20 MG capsule Take 1 capsule (20 mg total) by mouth daily.  ? ondansetron (ZOFRAN) 4 MG tablet Take 1 tablet (4 mg total) by mouth every 8 (eight) hours as needed for nausea or vomiting.  ? predniSONE (STERAPRED UNI-PAK 21 TAB) 10 MG (21) TBPK tablet Take by mouth daily. Take 6 tabs by mouth daily  for 2 days, then 5 tabs  for 2 days, then 4 tabs for 2 days, then 3 tabs for 2 days, 2 tabs for 2 days, then 1 tab by mouth daily for 2 days  ? triamcinolone cream (KENALOG) 0.5 % Apply 1 application. topically 2 (two) times daily.  ? cetirizine (ZYRTEC ALLERGY) 10 MG tablet Take 1 tablet (10 mg total) by mouth daily.  ? ?No facility-administered encounter medications on file as of 06/02/2021.  ? ? ?Follow-up: No follow-ups on file.  ? ?Becky Sax, MD ? ?

## 2021-11-04 ENCOUNTER — Encounter (HOSPITAL_COMMUNITY): Payer: Self-pay

## 2021-11-04 ENCOUNTER — Ambulatory Visit (HOSPITAL_COMMUNITY)
Admission: EM | Admit: 2021-11-04 | Discharge: 2021-11-04 | Disposition: A | Discharge status: Home / Self Care | Payer: Self-pay | Attending: Internal Medicine | Admitting: Internal Medicine

## 2021-11-04 DIAGNOSIS — M791 Myalgia, unspecified site: Secondary | ICD-10-CM

## 2021-11-04 DIAGNOSIS — S51052A Open bite, left elbow, initial encounter: Secondary | ICD-10-CM

## 2021-11-04 DIAGNOSIS — S1195XA Open bite of unspecified part of neck, initial encounter: Secondary | ICD-10-CM

## 2021-11-04 MED ORDER — AMOXICILLIN-POT CLAVULANATE 875-125 MG PO TABS: 1.0000 | ORAL_TABLET | Freq: Two times a day (BID) | ORAL | 0 refills | Status: DC

## 2021-11-04 MED ORDER — IBUPROFEN 600 MG PO TABS: 600.0000 mg | ORAL_TABLET | Freq: Four times a day (QID) | ORAL | 0 refills | Status: DC | PRN
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
## 2021-11-04 NOTE — Discharge Instructions (Addendum)
Please take medications as prescribed Ibuprofen as needed for pain If you notice worsening redness, pain, swelling please return to urgent care to be reevaluated.
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
## 2023-02-23 DIAGNOSIS — H5213 Myopia, bilateral: Secondary | ICD-10-CM | POA: Diagnosis not present

## 2023-04-03 ENCOUNTER — Ambulatory Visit
Admission: EM | Admit: 2023-04-03 | Discharge: 2023-04-03 | Disposition: A | Discharge status: Home / Self Care | Payer: BLUE CROSS/BLUE SHIELD | Attending: Family Medicine | Admitting: Family Medicine

## 2023-04-03 DIAGNOSIS — Z711 Person with feared health complaint in whom no diagnosis is made: Secondary | ICD-10-CM | POA: Diagnosis not present
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
## 2023-04-03 NOTE — ED Triage Notes (Signed)
 Pt presents with vaginal discharge and states she think she left a tampon in 2 days ago.   States it was a short tampon, had intercourse after and states her partner stated he felt something.

## 2023-04-03 NOTE — Discharge Instructions (Signed)
 Follow up as needed

## 2023-06-17 ENCOUNTER — Encounter (HOSPITAL_COMMUNITY): Payer: Self-pay | Admitting: Emergency Medicine

## 2023-06-17 ENCOUNTER — Ambulatory Visit (HOSPITAL_COMMUNITY)
Admission: EM | Admit: 2023-06-17 | Discharge: 2023-06-17 | Disposition: A | Discharge status: Home / Self Care | Attending: Physician Assistant | Admitting: Physician Assistant

## 2023-06-17 ENCOUNTER — Other Ambulatory Visit: Payer: Self-pay

## 2023-06-17 DIAGNOSIS — R6883 Chills (without fever): Secondary | ICD-10-CM | POA: Insufficient documentation

## 2023-06-17 DIAGNOSIS — N926 Irregular menstruation, unspecified: Secondary | ICD-10-CM | POA: Insufficient documentation

## 2023-06-17 DIAGNOSIS — R202 Paresthesia of skin: Secondary | ICD-10-CM | POA: Diagnosis not present

## 2023-06-17 DIAGNOSIS — R5383 Other fatigue: Secondary | ICD-10-CM | POA: Insufficient documentation

## 2023-06-17 LAB — COMPREHENSIVE METABOLIC PANEL
ALT: 13 U/L (ref 0–44)
AST: 26 U/L (ref 15–41)
Albumin: 4.3 g/dL (ref 3.5–5.0)
Alkaline Phosphatase: 51 U/L (ref 38–126)
Anion gap: 11 (ref 5–15)
BUN: 5 mg/dL — ABNORMAL LOW (ref 6–20)
CO2: 24 mmol/L (ref 22–32)
Calcium: 9.7 mg/dL (ref 8.9–10.3)
Chloride: 101 mmol/L (ref 98–111)
Creatinine, Ser: 0.76 mg/dL (ref 0.44–1.00)
GFR, Estimated: >60 mL/min (ref >60)
Glucose, Bld: 67 mg/dL — ABNORMAL LOW (ref 70–99)
Potassium: 4.1 mmol/L (ref 3.5–5.1)
Sodium: 136 mmol/L (ref 135–145)
Total Bilirubin: 0.7 mg/dL (ref 0.0–1.2)
Total Protein: 8 g/dL (ref 6.5–8.1)

## 2023-06-17 LAB — CBC WITH DIFFERENTIAL/PLATELET
Abs Immature Granulocytes: 0.04 10*3/uL (ref 0.00–0.07)
Basophils Absolute: 0.1 10*3/uL (ref 0.0–0.1)
Basophils Relative: 1 %
Eosinophils Absolute: 0.3 10*3/uL (ref 0.0–0.5)
Eosinophils Relative: 3 %
HCT: 42.3 % (ref 36.0–46.0)
Hemoglobin: 13.8 g/dL (ref 12.0–15.0)
Immature Granulocytes: 0 %
Lymphocytes Relative: 33 %
Lymphs Abs: 3.4 10*3/uL (ref 0.7–4.0)
MCH: 29.9 pg (ref 26.0–34.0)
MCHC: 32.6 g/dL (ref 30.0–36.0)
MCV: 91.6 fL (ref 80.0–100.0)
Monocytes Absolute: 0.9 10*3/uL (ref 0.1–1.0)
Monocytes Relative: 9 %
Neutro Abs: 5.6 10*3/uL (ref 1.7–7.7)
Neutrophils Relative %: 54 %
Platelets: 404 10*3/uL — ABNORMAL HIGH (ref 150–400)
RBC: 4.62 MIL/uL (ref 3.87–5.11)
RDW: 12.1 % (ref 11.5–15.5)
WBC: 10.4 10*3/uL (ref 4.0–10.5)
nRBC: 0 % (ref 0.0–0.2)

## 2023-06-17 LAB — VITAMIN B12: Vitamin B-12: 1019 pg/mL — ABNORMAL HIGH (ref 180–914)

## 2023-06-17 LAB — POCT URINALYSIS DIP (MANUAL ENTRY)
Bilirubin, UA: NEGATIVE
Blood, UA: NEGATIVE
Glucose, UA: NEGATIVE mg/dL
Ketones, POC UA: NEGATIVE mg/dL
Leukocytes, UA: NEGATIVE
Nitrite, UA: NEGATIVE
Protein Ur, POC: NEGATIVE mg/dL
Spec Grav, UA: <=1.005 — AB (ref 1.010–1.025)
Urobilinogen, UA: 0.2 U/dL
pH, UA: 6 (ref 5.0–8.0)

## 2023-06-17 LAB — HEMOGLOBIN A1C
Hgb A1c MFr Bld: 5.2 % (ref 4.8–5.6)
Mean Plasma Glucose: 102.54 mg/dL

## 2023-06-17 LAB — POCT URINE PREGNANCY: Preg Test, Ur: NEGATIVE

## 2023-06-17 LAB — FERRITIN: Ferritin: 40 ng/mL (ref 11–307)

## 2023-06-17 LAB — TSH: TSH: 1.652 u[IU]/mL (ref 0.350–4.500)

## 2023-06-17 NOTE — ED Provider Notes (Signed)
 [facility]   MRN: [id number] DOB: 1995-10-14  Subjective:   Yvonne Carlson is a 28 y.o. female presenting for fatigue.  Patient reports a generalized weakness, going on for some time now, but especially worse in the last 1 to 2 weeks.  States she has been feeling very tired.  Sometimes feels chilled.  Denies any fever.  No headaches.  No chest pain or shortness of breath.  No nausea, vomiting, diarrhea.  No recent sickness or travel.  She does feel like her hands and feet are tingling and feel cold quite often.  She reports that her last menstrual cycle was on 05/30/2023 and lasted about 7 days.  She has a history of irregular periods.  She does not use birth control.  She is sexually active and does not use condoms.  She does not follow with primary care or gynecology.  She denies taking any medications.  She denies taking any over-the-counter medications or supplements.  No current facility-administered medications for this encounter.  Current Outpatient Medications:    acetaminophen (TYLENOL) 500 MG tablet, Take 1 tablet (500 mg total) by mouth every 8 (eight) hours as needed for mild pain or headache., Disp: 30 tablet, Rfl: 0   albuterol (VENTOLIN HFA) 108 (90 Base) MCG/ACT inhaler, Inhale 1-2 puffs into the lungs every 6 (six) hours as needed for wheezing or shortness of breath., Disp: 90 g, Rfl: 0   amoxicillin-clavulanate (AUGMENTIN) 875-125 MG tablet, Take 1 tablet by mouth every 12 (twelve) hours., Disp: 14 tablet, Rfl: 0   calcium carbonate (TUMS - DOSED IN MG ELEMENTAL CALCIUM) 500 MG chewable tablet, Chew 2 tablets by mouth daily., Disp: , Rfl:    cetirizine (ZYRTEC ALLERGY) 10 MG tablet, Take 1 tablet (10 mg total) by mouth daily., Disp: 30 tablet, Rfl: 0   ibuprofen (ADVIL) 600 MG tablet, Take 1 tablet (600 mg total) by mouth every 6 (six) hours as needed., Disp: 30 tablet, Rfl: 0   metoCLOPramide (REGLAN) 10 MG tablet, Take 1 tablet (10 mg total) by mouth every 8  (eight) hours as needed (headache)., Disp: 10 tablet, Rfl: 0   minoxidil (MINOXIDIL FOR WOMEN) 2 % external solution, Apply topically 2 (two) times daily., Disp: 60 mL, Rfl: 3   omeprazole (PRILOSEC) 20 MG capsule, Take 1 capsule (20 mg total) by mouth daily., Disp: 14 capsule, Rfl: 0   ondansetron (ZOFRAN) 4 MG tablet, Take 1 tablet (4 mg total) by mouth every 8 (eight) hours as needed for nausea or vomiting., Disp: 4 tablet, Rfl: 0   predniSONE (STERAPRED UNI-PAK 21 TAB) 10 MG (21) TBPK tablet, Take by mouth daily. Take 6 tabs by mouth daily  for 2 days, then 5 tabs for 2 days, then 4 tabs for 2 days, then 3 tabs for 2 days, 2 tabs for 2 days, then 1 tab by mouth daily for 2 days, Disp: 42 tablet, Rfl: 0   triamcinolone cream (KENALOG) 0.5 %, Apply 1 application. topically 2 (two) times daily., Disp: 30 g, Rfl: 3   No Known Allergies  Past Medical History:  Diagnosis Date   Anxiety    Depression      Past Surgical History:  Procedure Laterality Date   NO PAST SURGERIES      Family History  Problem Relation Age of Onset   Hypertension Maternal Grandmother    Diabetes Maternal Grandmother     Social History   Tobacco Use   Smoking status: Never  Smokeless tobacco: Never  Vaping Use   Vaping status: Never Used  Substance Use Topics   Alcohol use: No   Drug use: No    ROS REFER TO HPI FOR PERTINENT POSITIVES AND NEGATIVES   Objective:   Vitals: BP 125/87 (BP Location: Right Arm)   Pulse 64   Temp 98.5 F (36.9 C) (Oral)   Resp 16   LMP 05/30/2023 (Approximate)   SpO2 100%   Physical Exam Vitals and nursing note reviewed.  Constitutional:      Appearance: Normal appearance. She is normal weight. She is not toxic-appearing.  HENT:     Head: Normocephalic and atraumatic.     Right Ear: Tympanic membrane, ear canal and external ear normal.     Left Ear: Tympanic membrane, ear canal and external ear normal.     Nose: Nose normal.     Mouth/Throat:     Mouth:  Mucous membranes are moist.  Eyes:     Extraocular Movements: Extraocular movements intact.     Conjunctiva/sclera: Conjunctivae normal.     Pupils: Pupils are equal, round, and reactive to light.  Cardiovascular:     Rate and Rhythm: Normal rate and regular rhythm.     Pulses: Normal pulses.     Heart sounds: Normal heart sounds.     Comments: Her feet are cold, but cap refill and pulses are normal Pulmonary:     Effort: Pulmonary effort is normal.     Breath sounds: Normal breath sounds.  Abdominal:     General: Abdomen is flat. Bowel sounds are normal.     Palpations: Abdomen is soft.  Musculoskeletal:        General: Normal range of motion.     Cervical back: Normal range of motion and neck supple.     Right lower leg: No edema.     Left lower leg: No edema.  Skin:    General: Skin is warm and dry.     Findings: No lesion or rash.  Neurological:     General: No focal deficit present.     Mental Status: She is alert and oriented to person, place, and time.     Cranial Nerves: No cranial nerve deficit.     Sensory: No sensory deficit.     Motor: No weakness.     Gait: Gait normal.  Psychiatric:        Mood and Affect: Mood normal.        Behavior: Behavior normal.        Thought Content: Thought content normal.        Judgment: Judgment normal.     Results for orders placed or performed during the hospital encounter of 06/17/23 (from the past 24 hours)  POCT urine pregnancy     Status: None   Collection Time: 06/17/23  3:19 PM  Result Value Ref Range   Preg Test, Ur Negative Negative  POC urinalysis dipstick     Status: Abnormal   Collection Time: 06/17/23  3:19 PM  Result Value Ref Range   Color, UA yellow yellow   Clarity, UA clear clear   Glucose, UA negative negative mg/dL   Bilirubin, UA negative negative   Ketones, POC UA negative negative mg/dL   Spec Grav, UA <=1.610 (A) 1.010 - 1.025   Blood, UA negative negative   pH, UA 6.0 5.0 - 8.0   Protein Ur,  POC negative negative mg/dL   Urobilinogen, UA 0.2 0.2 or 1.0 E.U./dL  Nitrite, UA Negative Negative   Leukocytes, UA Negative Negative    Assessment and Plan :   PDMP not reviewed this encounter.  1. Other fatigue   2. Chills   3. Tingling in extremities   4. Irregular menstrual cycle    28 year old female presenting with fatigue, chills, ongoing tingling in her extremities, regular menstrual cycles.  Vital signs were stable.  She was well-appearing..  She remained well-appearing and stable overall during her time in the urgent care.  Unclear etiology at this time.  Discussed with her with recent change in symptoms, could be viral or urinary related.  Collected a send out COVID-19 test.  Point-of-care urine and pregnancy test were both negative.  Discussed with her possible metabolic etiology, such as diabetes or thyroid disorder or anemia.  Labs were collected today.  Informed her that she would be contacted should any results be abnormal and treatment pending those results.  Advised her to follow-up with primary care.  Advised her to rest and stay well-hydrated.  She needs to make sure that she is eating regular well-balanced meals.  Return to care and ER precautions discussed.  Patient agreeable and understanding the plan.    AllwardtCrist Infante, PA-C 06/17/23 1531

## 2023-06-17 NOTE — ED Triage Notes (Signed)
 Pt reports increase of fatigue and SOB for the past 2 weeks.

## 2023-06-17 NOTE — Discharge Instructions (Signed)
 Good to meet you today.  Your urine specimen was normal.  Your pregnancy test was negative.  Blood work and a COVID test was performed.  You will be contacted with any abnormal results and treatment thereafter if needed.  I recommend trying to rest.  Stay well-hydrated and be sure to eat a well-balanced diet.  Avoid any other substances or supplements.  Present to the ER if any sudden worse or change in symptoms.  Follow-up with primary care and gynecology.

## 2023-06-19 LAB — SARS CORONAVIRUS 2 (TAT 6-24 HRS): SARS Coronavirus 2: NEGATIVE

## 2023-07-28 ENCOUNTER — Other Ambulatory Visit: Payer: Self-pay

## 2023-07-28 ENCOUNTER — Emergency Department (HOSPITAL_BASED_OUTPATIENT_CLINIC_OR_DEPARTMENT_OTHER)

## 2023-07-28 ENCOUNTER — Emergency Department (HOSPITAL_BASED_OUTPATIENT_CLINIC_OR_DEPARTMENT_OTHER)
Admission: EM | Admit: 2023-07-28 | Discharge: 2023-07-28 | Disposition: A | Discharge status: Home / Self Care | Attending: Emergency Medicine | Admitting: Emergency Medicine

## 2023-07-28 ENCOUNTER — Encounter (HOSPITAL_BASED_OUTPATIENT_CLINIC_OR_DEPARTMENT_OTHER): Payer: Self-pay | Admitting: Emergency Medicine

## 2023-07-28 DIAGNOSIS — Z3491 Encounter for supervision of normal pregnancy, unspecified, first trimester: Secondary | ICD-10-CM

## 2023-07-28 DIAGNOSIS — O26891 Other specified pregnancy related conditions, first trimester: Secondary | ICD-10-CM | POA: Diagnosis present

## 2023-07-28 DIAGNOSIS — O99611 Diseases of the digestive system complicating pregnancy, first trimester: Secondary | ICD-10-CM | POA: Diagnosis not present

## 2023-07-28 DIAGNOSIS — K852 Alcohol induced acute pancreatitis without necrosis or infection: Secondary | ICD-10-CM | POA: Diagnosis not present

## 2023-07-28 LAB — COMPREHENSIVE METABOLIC PANEL WITH GFR
ALT: 8 U/L (ref 0–44)
AST: 14 U/L — ABNORMAL LOW (ref 15–41)
Albumin: 4.3 g/dL (ref 3.5–5.0)
Alkaline Phosphatase: 68 U/L (ref 38–126)
Anion gap: 9 (ref 5–15)
BUN: 12 mg/dL (ref 6–20)
CO2: 25 mmol/L (ref 22–32)
Calcium: 9.8 mg/dL (ref 8.9–10.3)
Chloride: 103 mmol/L (ref 98–111)
Creatinine, Ser: 0.79 mg/dL (ref 0.44–1.00)
GFR, Estimated: >60 mL/min (ref >60)
Glucose, Bld: 71 mg/dL (ref 70–99)
Potassium: 4 mmol/L (ref 3.5–5.1)
Sodium: 137 mmol/L (ref 135–145)
Total Bilirubin: 0.2 mg/dL (ref 0.0–1.2)
Total Protein: 7.6 g/dL (ref 6.5–8.1)

## 2023-07-28 LAB — HCG, QUANTITATIVE, PREGNANCY: hCG, Beta Chain, Quant, S: 790 m[IU]/mL — ABNORMAL HIGH (ref <5)

## 2023-07-28 LAB — CBC WITH DIFFERENTIAL/PLATELET
Abs Immature Granulocytes: 0.03 10*3/uL (ref 0.00–0.07)
Basophils Absolute: 0.1 10*3/uL (ref 0.0–0.1)
Basophils Relative: 1 %
Eosinophils Absolute: 0.7 10*3/uL — ABNORMAL HIGH (ref 0.0–0.5)
Eosinophils Relative: 7 %
HCT: 38.6 % (ref 36.0–46.0)
Hemoglobin: 13 g/dL (ref 12.0–15.0)
Immature Granulocytes: 0 %
Lymphocytes Relative: 23 %
Lymphs Abs: 2.4 10*3/uL (ref 0.7–4.0)
MCH: 30.6 pg (ref 26.0–34.0)
MCHC: 33.7 g/dL (ref 30.0–36.0)
MCV: 90.8 fL (ref 80.0–100.0)
Monocytes Absolute: 0.9 10*3/uL (ref 0.1–1.0)
Monocytes Relative: 9 %
Neutro Abs: 6.2 10*3/uL (ref 1.7–7.7)
Neutrophils Relative %: 60 %
Platelets: 327 10*3/uL (ref 150–400)
RBC: 4.25 MIL/uL (ref 3.87–5.11)
RDW: 12.8 % (ref 11.5–15.5)
WBC: 10.4 10*3/uL (ref 4.0–10.5)
nRBC: 0 % (ref 0.0–0.2)

## 2023-07-28 LAB — URINALYSIS, ROUTINE W REFLEX MICROSCOPIC
Bilirubin Urine: NEGATIVE
Glucose, UA: NEGATIVE mg/dL
Hgb urine dipstick: NEGATIVE
Ketones, ur: NEGATIVE mg/dL
Leukocytes,Ua: NEGATIVE
Nitrite: NEGATIVE
Protein, ur: NEGATIVE mg/dL
Specific Gravity, Urine: 1.017 (ref 1.005–1.030)
pH: 7 (ref 5.0–8.0)

## 2023-07-28 LAB — LIPASE, BLOOD: Lipase: 289 U/L — ABNORMAL HIGH (ref 11–51)

## 2023-07-28 MED ORDER — ONDANSETRON 4 MG PO TBDP: 4.0000 mg | ORAL_TABLET | Freq: Three times a day (TID) | ORAL | 0 refills | Status: DC | PRN

## 2023-07-28 NOTE — ED Triage Notes (Signed)
 Pt caox4, stating she took a home preg test 2 days ago that was positive and is wanting to know how far along she is because she is wanting to get an abortion.

## 2023-07-28 NOTE — ED Provider Notes (Signed)
 [place] EMERGENCY DEPARTMENT AT [facility] Provider Note   CSN: [id number] Arrival date & time: 07/28/23  6578     History  Chief Complaint  Patient presents with   Abdominal Cramping    Yvonne Carlson is a 28 y.o. female without significant past medical history is reporting to ER with complaint of positive pregnancy test. She reports she has had some mild abdominal discomfort on and off for about 1-2 weeks. Last menstrual period suspected to be around 06/27/23. She has not noted vaginal discharge or bleeding. She would like to confirm she is pregnant and is wondering how far along she is. Denies significant discomfort, nausea, vomiting or diarrhea. Denies urinary symptoms.    Abdominal Cramping Associated symptoms include abdominal pain.       Home Medications Prior to Admission medications   Medication Sig Start Date End Date Taking? Authorizing Provider  acetaminophen  (TYLENOL ) 500 MG tablet Take 1 tablet (500 mg total) by mouth every 8 (eight) hours as needed for mild pain or headache. 07/05/19   Darr, Jacob, PA-C  albuterol  (VENTOLIN  HFA) 108 (90 Base) MCG/ACT inhaler Inhale 1-2 puffs into the lungs every 6 (six) hours as needed for wheezing or shortness of breath. 11/11/20   Harden Leyden, NP  amoxicillin -clavulanate (AUGMENTIN ) 875-125 MG tablet Take 1 tablet by mouth every 12 (twelve) hours. 11/04/21   Lamptey, Donley Furth, MD  calcium carbonate (TUMS - DOSED IN MG ELEMENTAL CALCIUM) 500 MG chewable tablet Chew 2 tablets by mouth daily.    [provider]  cetirizine  (ZYRTEC  ALLERGY) 10 MG tablet Take 1 tablet (10 mg total) by mouth daily. 07/05/19 08/04/19  Darr, Jacob, PA-C  ibuprofen  (ADVIL ) 600 MG tablet Take 1 tablet (600 mg total) by mouth every 6 (six) hours as needed. 11/04/21   Lamptey, Donley Furth, MD  metoCLOPramide  (REGLAN ) 10 MG tablet Take 1 tablet (10 mg total) by mouth every 8 (eight) hours as needed (headache). 03/20/15   Aisha Hove, MD   minoxidil  (MINOXIDIL  FOR WOMEN) 2 % external solution Apply topically 2 (two) times daily. 06/02/21   Abraham Abo, MD  omeprazole  (PRILOSEC) 20 MG capsule Take 1 capsule (20 mg total) by mouth daily. 01/31/15   Wenzel, Julie N, PA-C  ondansetron  (ZOFRAN ) 4 MG tablet Take 1 tablet (4 mg total) by mouth every 8 (eight) hours as needed for nausea or vomiting. 07/05/19   Darr, Derwood Flor, PA-C  predniSONE  (STERAPRED UNI-PAK 21 TAB) 10 MG (21) TBPK tablet Take by mouth daily. Take 6 tabs by mouth daily  for 2 days, then 5 tabs for 2 days, then 4 tabs for 2 days, then 3 tabs for 2 days, 2 tabs for 2 days, then 1 tab by mouth daily for 2 days 11/11/20   Harden Leyden, NP  triamcinolone  cream (KENALOG ) 0.5 % Apply 1 application. topically 2 (two) times daily. 06/02/21   Abraham Abo, MD      Allergies    Patient has no known allergies.    Review of Systems   Review of Systems  Gastrointestinal:  Positive for abdominal pain.    Physical Exam Updated Vital Signs BP 115/62   Pulse 85   Temp 99.2 F (37.3 C) (Oral)   Resp 16   Ht 4\' 9"  (1.448 m)   Wt 54.4 kg   LMP 06/27/2023 (Approximate)   SpO2 100%   BMI 25.97 kg/m  Physical Exam Vitals and nursing note reviewed.  Constitutional:      General:  She is not in acute distress.    Appearance: She is not toxic-appearing.  HENT:     Head: Normocephalic and atraumatic.  Eyes:     General: No scleral icterus.    Conjunctiva/sclera: Conjunctivae normal.  Cardiovascular:     Rate and Rhythm: Normal rate and regular rhythm.     Pulses: Normal pulses.     Heart sounds: Normal heart sounds.  Pulmonary:     Effort: Pulmonary effort is normal. No respiratory distress.     Breath sounds: Normal breath sounds.  Abdominal:     General: Abdomen is flat. Bowel sounds are normal. There is no distension.     Palpations: Abdomen is soft. There is no mass.     Tenderness: There is no abdominal tenderness.  Musculoskeletal:     Right lower leg: No  edema.     Left lower leg: No edema.  Skin:    General: Skin is warm and dry.     Findings: No lesion.  Neurological:     General: No focal deficit present.     Mental Status: She is alert and oriented to person, place, and time. Mental status is at baseline.     ED Results / Procedures / Treatments   Labs (all labs ordered are listed, but only abnormal results are displayed) Labs Reviewed  COMPREHENSIVE METABOLIC PANEL WITH GFR - Abnormal; Notable for the following components:      Result Value   AST 14 (*)    All other components within normal limits  LIPASE, BLOOD - Abnormal; Notable for the following components:   Lipase 289 (*)    All other components within normal limits  CBC WITH DIFFERENTIAL/PLATELET - Abnormal; Notable for the following components:   Eosinophils Absolute 0.7 (*)    All other components within normal limits  HCG, QUANTITATIVE, PREGNANCY - Abnormal; Notable for the following components:   hCG, Beta Chain, Quant, S 790 (*)    All other components within normal limits  URINALYSIS, ROUTINE W REFLEX MICROSCOPIC    EKG None  Radiology US  OB Comp < 14 Wks Result Date: 07/28/2023 CLINICAL DATA:  Pelvic pain x1 day. EXAM: OBSTETRIC <14 WK US  AND TRANSVAGINAL OB US  TECHNIQUE: Transabdominal ultrasound examination was performed for complete evaluation of the gestation as well as the maternal uterus, adnexal regions, and pelvic cul-de-sac. It should be noted that the transvaginal portion of the study was declined by the patient. COMPARISON:  None Available. FINDINGS: Intrauterine gestational sac: None Yolk sac:  Not Visualized. Embryo:  Not Visualized. Cardiac Activity: Not Visualized. Heart Rate: N/A  bpm Subchorionic hemorrhage:  None visualized. Maternal uterus/adnexae: The uterus measures 10.0 cm x 5.2 cm x 5.9 cm. The endometrium measures 8.2 mm in thickness. The right ovary measures 3.5 cm x 2.3 cm x 2.1 cm and is normal in appearance. The left ovary measures 3.6  cm x 2.8 cm x 2.6 cm and is normal in appearance. No pelvic free fluid is seen. IMPRESSION: No evidence of an intrauterine pregnancy. Correlation with follow-up pelvic ultrasound and serial beta HCG levels is recommended if this remains of clinical concern. Electronically Signed   By: Virgle Grime M.D.   On: 07/28/2023 11:44    Procedures Procedures    Medications Ordered in ED Medications - No data to display  ED Course/ Medical Decision Making/ A&P Clinical Course as of 07/28/23 1233  Fri Jul 28, 2023  1005 Lipase(!): 289 Denies epigastric pain. Pain is located in lower  abdomen. No history of pancreatitis. Reports EtOH use, but not daily, reports she stopped drinking when she found out she was pregnant, no known history of gallstones. Tolerating oral intake, symptoms well controlled.  [JB]  1042 Per radiology tech, patient refusing transvaginal US , so transabdominal US  completed  [JB]    Clinical Course User Index [JB] Dione Mccombie, Kandace Organ, PA-C                                 Medical Decision Making Amount and/or Complexity of Data Reviewed Labs: ordered. Decision-making details documented in ED Course. Radiology: ordered.  Risk Prescription drug management.   This patient presents to the ED for concern of abdominal pain, this involves an extensive number of treatment options, and is a complaint that carries with it a high risk of complications and morbidity.  The differential diagnosis includes torsion, UTI, ectopic, appendicitis, pancreatitis, cholecystitis , SBO   Co morbidities that complicate the patient evaluation  Anxiety Depression    Additional history obtained:  Additional history obtained from 07/27/23 OV   Lab Tests:  I personally interpreted labs.  The pertinent results include:   CBC with no leukocytosis and no anemia CMP without significant electrolyte abnormality.  Lipase 280 UA without urinary tract infection Hcg 790 -- consistent with estimated  gestation age of 3-4 weeks     Imaging Studies ordered:  I ordered imaging studies including Pelvic US  to r/o ectopic   I independently visualized and interpreted imaging which showed no acute pathology.   I agree with the radiologist interpretation   Cardiac Monitoring: / EKG:  The patient was maintained on a cardiac monitor.    Problem List / ED Course / Critical interventions / Medication management  Presented to emergency room with complaint of lower abdominal pain.  She says this is mild and cramping in nature has been ongoing for the past week.  She is also noticed some weight gain and missed period.  She thinks that she might be pregnant.  She has no associated nausea vomiting or diarrhea I abdominal exam is nondistended nontender.  She has no overlying rash.  She is hemodynamically stable without fever or tachycardia.  Will check some abdominal labs, UA and hCG quant. She did have positive pregnancy test with [facility] yesterday. Given pain will rule ectopic with pelvic US . LMP 06/27/23 thus estimated gestation age 16-4 weeks. No urinary symptoms of vaginal discharge.  Discussed all labs and imaging with patient.  Feel it is most appropriate for her to follow-up with OB and have hCG and ultrasound rechecked.  She does have an OB/GYN she is already planning on following up with. Given her precautions.  As for elevated pancreatitis it is unclear the significance of this she does not have any focal epigastric pain but is having some intermittent abd pain.  She is tolerating oral intake and overall has reassuring labs, no sign of systemic illness, suspect secondary to EtOH.  We discussed discontinuing EtOH, staying well-hydrated and symptom management given return precautions.  Patient is stable for discharge.  I have reviewed the patients home medicines and have made adjustments as needed   Plan  F/u w/ PCP in 2-3d to ensure resolution of sx.  Patient was given return precautions. Patient  stable for discharge at this time.  Patient educated on sx/dx and verbalized understanding of plan. Return to ER w/ new or worsening sx.  Final Clinical Impression(s) / ED Diagnoses Final diagnoses:  Normal pregnancy in first trimester  Alcohol-induced acute pancreatitis, unspecified complication status    Rx / DC Orders ED Discharge Orders     None         Eudora Heron, PA-C 07/28/23 1235    Albertus Hughs, DO 07/28/23 1242

## 2023-07-28 NOTE — Discharge Instructions (Addendum)
 These follow-up with primary care doctor for pancreatitis.  You should stay well-hydrated with water alternating Pedialyte and Gatorade.  You can try bland foods like applesauce, toast and rice.  Try Zofran  as needed for nausea or vomiting.  If your symptoms get worse you are no longer tolerating oral intake you need to return to ER for further testing. Since you are pregnant, I would take Tylenol  1000mg  every 6 hours for pain control -- avoid Ibuprofen  or NSAIDs.   Your hCG is positive, this is a pregnancy hormone.  Given last period I estimate you are on 3 to [redacted] weeks pregnant.  Please follow up with OB for further management and care -- you will need repeat US  and repeat HCG.  Return for severe abdominal pain, vaginal bleeding or discharge.

## 2023-09-05 ENCOUNTER — Encounter (HOSPITAL_COMMUNITY): Payer: Self-pay

## 2023-09-05 ENCOUNTER — Emergency Department (HOSPITAL_COMMUNITY)
Admission: EM | Admit: 2023-09-05 | Discharge: 2023-09-05 | Disposition: A | Discharge status: Home / Self Care | Attending: Emergency Medicine | Admitting: Emergency Medicine

## 2023-09-05 ENCOUNTER — Ambulatory Visit
Admission: EM | Admit: 2023-09-05 | Discharge: 2023-09-05 | Disposition: A | Discharge status: Other Acute Inpatient Hospital | Attending: Family Medicine | Admitting: Family Medicine

## 2023-09-05 ENCOUNTER — Other Ambulatory Visit: Payer: Self-pay

## 2023-09-05 ENCOUNTER — Emergency Department (HOSPITAL_COMMUNITY)

## 2023-09-05 DIAGNOSIS — O034 Incomplete spontaneous abortion without complication: Secondary | ICD-10-CM

## 2023-09-05 DIAGNOSIS — Z8759 Personal history of other complications of pregnancy, childbirth and the puerperium: Secondary | ICD-10-CM | POA: Diagnosis not present

## 2023-09-05 DIAGNOSIS — Z3A00 Weeks of gestation of pregnancy not specified: Secondary | ICD-10-CM

## 2023-09-05 DIAGNOSIS — R42 Dizziness and giddiness: Secondary | ICD-10-CM | POA: Diagnosis not present

## 2023-09-05 DIAGNOSIS — Z3201 Encounter for pregnancy test, result positive: Secondary | ICD-10-CM

## 2023-09-05 DIAGNOSIS — D72829 Elevated white blood cell count, unspecified: Secondary | ICD-10-CM | POA: Insufficient documentation

## 2023-09-05 DIAGNOSIS — N939 Abnormal uterine and vaginal bleeding, unspecified: Secondary | ICD-10-CM | POA: Diagnosis present

## 2023-09-05 LAB — COMPREHENSIVE METABOLIC PANEL WITH GFR
ALT: 9 U/L (ref 0–44)
AST: 30 U/L (ref 15–41)
Albumin: 4.5 g/dL (ref 3.5–5.0)
Alkaline Phosphatase: 67 U/L (ref 38–126)
Anion gap: 10 (ref 5–15)
BUN: 7 mg/dL (ref 6–20)
CO2: 23 mmol/L (ref 22–32)
Calcium: 9.7 mg/dL (ref 8.9–10.3)
Chloride: 103 mmol/L (ref 98–111)
Creatinine, Ser: 0.76 mg/dL (ref 0.44–1.00)
GFR, Estimated: >60 mL/min (ref >60)
Glucose, Bld: 93 mg/dL (ref 70–99)
Potassium: 3.9 mmol/L (ref 3.5–5.1)
Sodium: 136 mmol/L (ref 135–145)
Total Bilirubin: 0.7 mg/dL (ref 0.0–1.2)
Total Protein: 9.3 g/dL — ABNORMAL HIGH (ref 6.5–8.1)

## 2023-09-05 LAB — URINALYSIS, ROUTINE W REFLEX MICROSCOPIC
Bilirubin Urine: NEGATIVE
Glucose, UA: NEGATIVE mg/dL
Ketones, ur: NEGATIVE mg/dL
Nitrite: NEGATIVE
Protein, ur: NEGATIVE mg/dL
RBC / HPF: >50 RBC/hpf (ref 0–5)
Specific Gravity, Urine: 1.003 — ABNORMAL LOW (ref 1.005–1.030)
pH: 8 (ref 5.0–8.0)

## 2023-09-05 LAB — CBC
HCT: 41.6 % (ref 36.0–46.0)
Hemoglobin: 13.3 g/dL (ref 12.0–15.0)
MCH: 29.8 pg (ref 26.0–34.0)
MCHC: 32 g/dL (ref 30.0–36.0)
MCV: 93.3 fL (ref 80.0–100.0)
Platelets: 475 10*3/uL — ABNORMAL HIGH (ref 150–400)
RBC: 4.46 MIL/uL (ref 3.87–5.11)
RDW: 12.6 % (ref 11.5–15.5)
WBC: 11.8 10*3/uL — ABNORMAL HIGH (ref 4.0–10.5)
nRBC: 0 % (ref 0.0–0.2)

## 2023-09-05 LAB — POCT FASTING CBG KUC MANUAL ENTRY: POCT Glucose (KUC): 114 mg/dL — AB (ref 70–99)

## 2023-09-05 LAB — POCT URINALYSIS DIP (MANUAL ENTRY)
Bilirubin, UA: NEGATIVE
Glucose, UA: NEGATIVE mg/dL
Ketones, POC UA: NEGATIVE mg/dL
Leukocytes, UA: NEGATIVE
Nitrite, UA: NEGATIVE
Protein Ur, POC: NEGATIVE mg/dL
Spec Grav, UA: <=1.005 — AB (ref 1.010–1.025)
Urobilinogen, UA: 0.2 U/dL
pH, UA: 6 (ref 5.0–8.0)

## 2023-09-05 LAB — LIPASE, BLOOD: Lipase: 33 U/L (ref 11–51)

## 2023-09-05 LAB — HCG, QUANTITATIVE, PREGNANCY: hCG, Beta Chain, Quant, S: 1199 m[IU]/mL — ABNORMAL HIGH (ref <5)

## 2023-09-05 LAB — POCT URINE PREGNANCY: Preg Test, Ur: POSITIVE — AB

## 2023-09-05 MED ORDER — SODIUM CHLORIDE 0.9 % IV SOLN
1.0000 g | Freq: Once | INTRAVENOUS | Status: AC
  Administered 2023-09-05: 1 g via INTRAVENOUS
  Filled 2023-09-05: qty 10

## 2023-09-05 MED ORDER — SODIUM CHLORIDE 0.9 % IV BOLUS
1000.0000 mL | Freq: Once | INTRAVENOUS | Status: AC
  Administered 2023-09-05: 1000 mL via INTRAVENOUS

## 2023-09-05 MED ORDER — ACETAMINOPHEN 500 MG PO TABS
1000.0000 mg | ORAL_TABLET | Freq: Once | ORAL | Status: AC
  Administered 2023-09-05: 1000 mg via ORAL
  Filled 2023-09-05: qty 2

## 2023-09-05 MED ORDER — OXYCODONE HCL 5 MG PO TABS: 5.0000 mg | ORAL_TABLET | ORAL | 0 refills | Status: AC | PRN

## 2023-09-05 MED ORDER — ONDANSETRON HCL 4 MG PO TABS: 4.0000 mg | ORAL_TABLET | ORAL | 0 refills | Status: AC | PRN

## 2023-09-05 NOTE — ED Triage Notes (Addendum)
 Pt c/o dry mouth. Pt states she drank 2 bottles of water right after another and had a large BM that was liquid today. Pt states when she was in the store she felt like she was going to faint. Pt states prior to that she hadn't ate breakfast. She states she felt better after eating, but her mouth is very dry. Pt states she did have an abortion 3 wks ago

## 2023-09-05 NOTE — Discharge Instructions (Addendum)
 It was a pleasure caring for you today in the emergency department.  Expect to hear from OBGYN office tomorrow regarding scheduling for D/C  Please return to the emergency department for any worsening or worrisome symptoms including but not limited to: worsening bleeding, abdominal pain, fever, vomiting, etc      RESOURCE GUIDE  Chronic Pain Problems: Contact [facility]  [phone] Patients need to be referred by their primary care doctor.  Insufficient Money for Medicine: Contact [employer]:  call [phone]  No Primary Care Doctor: Call [facility]  [phone] - can help you locate a primary care doctor that  accepts your insurance, provides certain services, etc. Physician Referral Service- [phone]  Agencies that provide inexpensive medical care: [facility]  [phone] [facility]  [phone] [facility]  [phone] [facility]  [phone] [facility]  [phone] [facility]  [phone]  Medicaid-accepting [facility] Providers: [facility]- [address]  [phone], Mon-Fri 9am-7pm, Sat 9am-1pm [facility]- [address] [place], [address] [place]  [phone] [facility]- [address] [place]  [phone] [facility]- [address]  [phone] Jonathon Neighbors- [address] [place], [address], [phone]  Only accepts [place] patients after they have their name  applied to their card  Self Pay (no insurance) in [facility]: Sickle Cell Patients - [facility]  [address] [place], [phone] [facility]- [address] [place]  [phone]       [facility] [place]- [address]       -     [facility]- see information above (Speak to [employer] if you do not have insurance)       -  [facility]- [address],  [phone]       -  [facility]- [address], [phone]       -  Dr Sherlene Diss-  [address], [place], [phone]       -  [facility] - [address] [phone]       -  [facility]- [address] [phone], also [address] [phone]       -     [facility]- [address] [place], [phone], 1st & 3rd Saturday         every month, 10am-1pm  -     [facility]   [address] [place], [place].   Phone:  [phone], Fax:  [phone]. Hours of Operation:  9 am - 6 pm, M-F.  -     [facility]   [address], [place]   Phone: [phone], Fax: [phone]. Hours of Operation:  8:30 am - 5:30 pm, M-F.    Dental Assistance If unable to pay or uninsured, contact:  [facility]. to become qualified for the adult dental clinic.  Patients with Medicaid: [facility] [phone] [address], [phone] [address], [phone]  If unable to pay, or uninsured, contact [facility] [phone] in [address], [phone] in [facility]) to become qualified for the adult dental clinic  [facility] [address]  [place], [place] [address] [phone] [email or web address]  Other Proofreader Services: [facility]- [address] [place], [place], [place], [address], [phone], Ext. [phone], 2nd and 4th Thursday of the month at 6:30am.  10 clients each day by appointment, can sometimes see walk-in patients if someone does not show for an appointment. [facility]- [address] [place], [place], [address], [phone] [facility] [address], [place], [place], [address], [phone] [facility]- [phone] [facility]- [phone] [facility] [phone]

## 2023-09-05 NOTE — ED Provider Notes (Addendum)
 [facility]    CSN: [id number] Arrival date & time: 09/05/23  1021      History   Chief Complaint Chief Complaint  Patient presents with   dry mouth    HPI Yvonne Carlson is a 28 y.o. female presents for lightheadedness and feeling dehydrated.  Patient states today she awoke and thought her mouth was very dry.  She drank  two 16 ounce bottles of water and still felt as though she was dehydrated.  She states she did have diarrhea after drinking as much water.  She states her mouth still feels very dry.  She was also beginning to feel lightheaded but this has improved greatly after she ate.  She denies any fevers, nausea/vomiting, abdominal pain, dysuria.  No URI symptoms.  No chest pain or shortness of breath.  No syncope.  She reports a history of low blood sugar but no history of diabetes.  She does take allergy medicine daily.  She did have a chemical abortion 3 weeks ago and is still having some vaginal bleeding with clots.  No other concerns at this time.  HPI  Past Medical History:  Diagnosis Date   Anxiety    Depression     There are no active problems to display for this patient.   Past Surgical History:  Procedure Laterality Date   NO PAST SURGERIES      OB History     Gravida  0   Para  0   Term  0   Preterm  0   AB  0   Living  0      SAB  0   IAB  0   Ectopic  0   Multiple  0   Live Births               Home Medications    Prior to Admission medications   Medication Sig Start Date End Date Taking? Authorizing Provider  acetaminophen  (TYLENOL ) 500 MG tablet Take 1 tablet (500 mg total) by mouth every 8 (eight) hours as needed for mild pain or headache. 07/05/19   Darr, Jacob, PA-C  albuterol  (VENTOLIN  HFA) 108 (90 Base) MCG/ACT inhaler Inhale 1-2 puffs into the lungs every 6 (six) hours as needed for wheezing or shortness of breath. 11/11/20   Harden Leyden, NP  amoxicillin -clavulanate (AUGMENTIN ) 875-125 MG tablet Take 1  tablet by mouth every 12 (twelve) hours. 11/04/21   Lamptey, Donley Furth, MD  calcium carbonate (TUMS - DOSED IN MG ELEMENTAL CALCIUM) 500 MG chewable tablet Chew 2 tablets by mouth daily.    [provider]  cetirizine  (ZYRTEC  ALLERGY) 10 MG tablet Take 1 tablet (10 mg total) by mouth daily. 07/05/19 08/04/19  Darr, Jacob, PA-C  ibuprofen  (ADVIL ) 600 MG tablet Take 1 tablet (600 mg total) by mouth every 6 (six) hours as needed. 11/04/21   Lamptey, Donley Furth, MD  metoCLOPramide  (REGLAN ) 10 MG tablet Take 1 tablet (10 mg total) by mouth every 8 (eight) hours as needed (headache). 03/20/15   Aisha Hove, MD  minoxidil  (MINOXIDIL  FOR WOMEN) 2 % external solution Apply topically 2 (two) times daily. 06/02/21   Abraham Abo, MD  omeprazole  (PRILOSEC) 20 MG capsule Take 1 capsule (20 mg total) by mouth daily. 01/31/15   Wenzel, Julie N, PA-C  ondansetron  (ZOFRAN ) 4 MG tablet Take 1 tablet (4 mg total) by mouth every 8 (eight) hours as needed for nausea or vomiting. 07/05/19   Darr, Jacob, PA-C  ondansetron  (ZOFRAN -ODT) 4 MG disintegrating tablet Take 1 tablet (4 mg total) by mouth every 8 (eight) hours as needed for nausea or vomiting. 07/28/23   Barrett, Kandace Organ, PA-C  predniSONE  (STERAPRED UNI-PAK 21 TAB) 10 MG (21) TBPK tablet Take by mouth daily. Take 6 tabs by mouth daily  for 2 days, then 5 tabs for 2 days, then 4 tabs for 2 days, then 3 tabs for 2 days, 2 tabs for 2 days, then 1 tab by mouth daily for 2 days 11/11/20   Harden Leyden, NP  triamcinolone  cream (KENALOG ) 0.5 % Apply 1 application. topically 2 (two) times daily. 06/02/21   Abraham Abo, MD    Family History Family History  Problem Relation Age of Onset   Hypertension Maternal Grandmother    Diabetes Maternal Grandmother     Social History Social History   Tobacco Use   Smoking status: Never   Smokeless tobacco: Never  Vaping Use   Vaping status: Never Used  Substance Use Topics   Alcohol use: No   Drug use: No      Allergies   Patient has no known allergies.   Review of Systems Review of Systems  HENT:         Dry mouth  Neurological:  Positive for light-headedness.     Physical Exam Triage Vital Signs ED Triage Vitals  Encounter Vitals Group     BP 09/05/23 1031 (!) 134/92     Systolic BP Percentile --      Diastolic BP Percentile --      Pulse Rate 09/05/23 1031 86     Resp 09/05/23 1031 17     Temp 09/05/23 1031 97.8 F (36.6 C)     Temp Source 09/05/23 1031 Oral     SpO2 09/05/23 1031 98 %     Weight --      Height --      Head Circumference --      Peak Flow --      Pain Score 09/05/23 1029 0     Pain Loc --      Pain Education --      Exclude from Growth Chart --    No data found.  Updated Vital Signs BP (!) 134/92   Pulse 86   Temp 97.8 F (36.6 C) (Oral)   Resp 17   SpO2 98%   Breastfeeding No   Visual Acuity Right Eye Distance:   Left Eye Distance:   Bilateral Distance:    Right Eye Near:   Left Eye Near:    Bilateral Near:     Physical Exam Vitals and nursing note reviewed.  Constitutional:      General: She is not in acute distress.    Appearance: Normal appearance. She is not ill-appearing.  HENT:     Head: Normocephalic and atraumatic.     Mouth/Throat:     Mouth: Mucous membranes are moist.  Eyes:     Extraocular Movements: Extraocular movements intact.     Conjunctiva/sclera: Conjunctivae normal.     Pupils: Pupils are equal, round, and reactive to light.  Cardiovascular:     Rate and Rhythm: Normal rate and regular rhythm.     Heart sounds: Normal heart sounds.  Pulmonary:     Effort: Pulmonary effort is normal.     Breath sounds: Normal breath sounds.  Skin:    General: Skin is warm and dry.  Neurological:     General: No focal deficit present.  Mental Status: She is alert and oriented to person, place, and time.  Psychiatric:        Mood and Affect: Mood normal.        Behavior: Behavior normal.      UC Treatments  / Results  Labs (all labs ordered are listed, but only abnormal results are displayed) Labs Reviewed  POCT URINE PREGNANCY - Abnormal; Notable for the following components:      Result Value   Preg Test, Ur Positive (*)    All other components within normal limits  POCT URINALYSIS DIP (MANUAL ENTRY) - Abnormal; Notable for the following components:   Color, UA colorless (*)    Spec Grav, UA <=1.005 (*)    Blood, UA large (*)    All other components within normal limits  POCT FASTING CBG [facility] MANUAL ENTRY - Abnormal; Notable for the following components:   POCT Glucose ([facility]) 114 (*)    All other components within normal limits    EKG   Radiology No results found.  Procedures Procedures (including critical care time)  Medications Ordered in UC Medications - No data to display  Initial Impression / Assessment and Plan / UC Course  I have reviewed the triage vital signs and the nursing notes.  Pertinent labs & imaging results that were available during my care of the patient were reviewed by me and considered in my medical decision making (see chart for details).     I reviewed exam and symptoms with patient.  Urine without signs of UTI but does show a large blood.  Her blood sugar is within normal limits.  She still is testing positive for pregnancy.  Given her symptoms I did advise that she go to [facility] for further evaluation and workup.  She is in agreement with plan and will go POV to the emergency room. Final Clinical Impressions(s) / UC Diagnoses   Final diagnoses:  Feeling light headed  Positive urine pregnancy test  Abortion history     Discharge Instructions      Please go to the emergency room for further evaluation of your symptoms   ED Prescriptions   None    PDMP not reviewed this encounter.   Alleen Arbour, NP 09/05/23 1118    Alleen Arbour, NP 09/05/23 [phone]

## 2023-09-05 NOTE — ED Provider Notes (Signed)
 [place] EMERGENCY DEPARTMENT AT [facility] Provider Note   CSN: [id number] Arrival date & time: 09/05/23  1135     History  Chief Complaint  Patient presents with   Back Pain   Vaginal Bleeding   Diarrhea    Yvonne Carlson is a 28 y.o. female presents to the ED today due to lightheadedness and feeling dehydrated.  Notably she underwent chemical abortion 3 weeks ago, May 16, and has had consistent vaginal bleeding since then.  Notes that bleeding today is less in volume, more dark in appearance, still producing clots.  Was seen at urgent care today as she stated she felt dehydrated and was seeking management for same.  Upon her evaluation at the urgent care they referred her to the emergency department due to positive hCG as well as due to symptoms concerning for potential anemia.  She denies any abdominal pain, has intermittent cramping, but at presentation does not have any cramping or abdominal discomfort.  Occasionally endorses lower back pain that is self-limiting and again is not present at the current moment.  Pain is limited to the spinal area and is also noted in the upper thoracic region, more short sporadic pain that again is self-limiting and has resolved at this time.   Back Pain Associated symptoms: no abdominal pain, no dysuria and no fever   Vaginal Bleeding Associated symptoms: back pain   Associated symptoms: no abdominal pain, no dysuria, no fever and no vaginal discharge   Diarrhea Associated symptoms: no abdominal pain, no chills and no fever        Home Medications Prior to Admission medications   Medication Sig Start Date End Date Taking? Authorizing Provider  ondansetron  (ZOFRAN ) 4 MG tablet Take 1 tablet (4 mg total) by mouth every 4 (four) hours as needed for nausea or vomiting. 09/05/23  Yes Russella Courts A, DO  oxyCODONE  (ROXICODONE ) 5 MG immediate release tablet Take 1 tablet (5 mg total) by mouth every 4 (four) hours as needed for severe  pain (pain score 7-10). 09/05/23  Yes Teddi Favors, DO  acetaminophen  (TYLENOL ) 500 MG tablet Take 1 tablet (500 mg total) by mouth every 8 (eight) hours as needed for mild pain or headache. 07/05/19   Darr, Jacob, PA-C  albuterol  (VENTOLIN  HFA) 108 (90 Base) MCG/ACT inhaler Inhale 1-2 puffs into the lungs every 6 (six) hours as needed for wheezing or shortness of breath. 11/11/20   Harden Leyden, NP  amoxicillin -clavulanate (AUGMENTIN ) 875-125 MG tablet Take 1 tablet by mouth every 12 (twelve) hours. 11/04/21   Lamptey, Donley Furth, MD  calcium carbonate (TUMS - DOSED IN MG ELEMENTAL CALCIUM) 500 MG chewable tablet Chew 2 tablets by mouth daily.    [provider]  cetirizine  (ZYRTEC  ALLERGY) 10 MG tablet Take 1 tablet (10 mg total) by mouth daily. 07/05/19 08/04/19  Darr, Jacob, PA-C  ibuprofen  (ADVIL ) 600 MG tablet Take 1 tablet (600 mg total) by mouth every 6 (six) hours as needed. 11/04/21   Lamptey, Donley Furth, MD  metoCLOPramide  (REGLAN ) 10 MG tablet Take 1 tablet (10 mg total) by mouth every 8 (eight) hours as needed (headache). 03/20/15   Aisha Hove, MD  minoxidil  (MINOXIDIL  FOR WOMEN) 2 % external solution Apply topically 2 (two) times daily. 06/02/21   Abraham Abo, MD  omeprazole  (PRILOSEC) 20 MG capsule Take 1 capsule (20 mg total) by mouth daily. 01/31/15   Millard Alliance, PA-C  ondansetron  (ZOFRAN -ODT) 4 MG disintegrating tablet Take 1  tablet (4 mg total) by mouth every 8 (eight) hours as needed for nausea or vomiting. 07/28/23   Barrett, Kandace Organ, PA-C  predniSONE  (STERAPRED UNI-PAK 21 TAB) 10 MG (21) TBPK tablet Take by mouth daily. Take 6 tabs by mouth daily  for 2 days, then 5 tabs for 2 days, then 4 tabs for 2 days, then 3 tabs for 2 days, 2 tabs for 2 days, then 1 tab by mouth daily for 2 days 11/11/20   Harden Leyden, NP  triamcinolone  cream (KENALOG ) 0.5 % Apply 1 application. topically 2 (two) times daily. 06/02/21   Abraham Abo, MD      Allergies    Patient has no  known allergies.    Review of Systems   Review of Systems  Constitutional:  Negative for chills and fever.  Gastrointestinal:  Positive for diarrhea. Negative for abdominal distention and abdominal pain.  Genitourinary:  Positive for vaginal bleeding. Negative for dysuria, urgency, vaginal discharge and vaginal pain.  Musculoskeletal:  Positive for back pain.  Neurological:  Positive for light-headedness.  All other systems reviewed and are negative.   Physical Exam Updated Vital Signs BP 134/79 (BP Location: Right Arm)   Pulse 87   Temp 99.3 F (37.4 C) (Oral)   Resp 18   Ht 4' 9 (1.448 m)   Wt 63.5 kg   SpO2 100%   BMI 30.30 kg/m  Physical Exam Vitals and nursing note reviewed.  Constitutional:      General: She is not in acute distress.    Appearance: Normal appearance.  HENT:     Head: Normocephalic and atraumatic.     Mouth/Throat:     Mouth: Mucous membranes are moist.     Pharynx: Oropharynx is clear.  Eyes:     Extraocular Movements: Extraocular movements intact.     Conjunctiva/sclera: Conjunctivae normal.     Pupils: Pupils are equal, round, and reactive to light.  Cardiovascular:     Rate and Rhythm: Normal rate and regular rhythm.     Pulses: Normal pulses.     Heart sounds: Normal heart sounds. No murmur heard.    No friction rub. No gallop.  Pulmonary:     Effort: Pulmonary effort is normal.     Breath sounds: Normal breath sounds.  Abdominal:     General: Abdomen is flat. Bowel sounds are normal.     Palpations: Abdomen is soft.     Tenderness: There is abdominal tenderness in the right lower quadrant.  Musculoskeletal:        General: Normal range of motion.     Cervical back: Normal range of motion and neck supple.     Right lower leg: No edema.     Left lower leg: No edema.  Skin:    General: Skin is warm and dry.     Capillary Refill: Capillary refill takes less than 2 seconds.  Neurological:     General: No focal deficit present.      Mental Status: She is alert and oriented to person, place, and time. Mental status is at baseline.     GCS: GCS eye subscore is 4. GCS verbal subscore is 5. GCS motor subscore is 6.  Psychiatric:        Mood and Affect: Mood normal.     ED Results / Procedures / Treatments   Labs (all labs ordered are listed, but only abnormal results are displayed) Labs Reviewed  COMPREHENSIVE METABOLIC PANEL WITH GFR - Abnormal; Notable  for the following components:      Result Value   Total Protein 9.3 (*)    All other components within normal limits  CBC - Abnormal; Notable for the following components:   WBC 11.8 (*)    Platelets 475 (*)    All other components within normal limits  URINALYSIS, ROUTINE W REFLEX MICROSCOPIC - Abnormal; Notable for the following components:   Color, Urine PINK (*)    APPearance HAZY (*)    Specific Gravity, Urine 1.003 (*)    Hgb urine dipstick LARGE (*)    Leukocytes,Ua MODERATE (*)    Bacteria, UA RARE (*)    All other components within normal limits  HCG, QUANTITATIVE, PREGNANCY - Abnormal; Notable for the following components:   hCG, Beta Chain, Quant, S 1,199 (*)    All other components within normal limits  LIPASE, BLOOD    EKG None  Radiology US  OB Comp < 14 Wks Result Date: 09/05/2023 CLINICAL DATA:  post-chem abortion hemorrhage EXAM: OBSTETRIC <14 WK US  AND TRANSVAGINAL OB US  TECHNIQUE: Both transabdominal and transvaginal ultrasound examinations were performed for complete evaluation of the gestation as well as the maternal uterus, adnexal regions, and pelvic cul-de-sac. Transvaginal technique was performed to assess early pregnancy. COMPARISON:  Jul 28, 2023 FINDINGS: Intrauterine gestational sac: Not present, at this time. Yolk sac:  Not present, at this time. Fetal Pole:  Not present, at this time. Cardiac Activity: Not present, at this time. Subchorionic hemorrhage:  None visualized. Maternal uterus/adnexae: The right ovary measures 3.7 x 1.9 x  2.9 cm. The left ovary measures 3.2 x 2.3 x 2.5 cm. Heterogeneous material within the lower endometrium, measuring 2 x 1.2 x 4 cm. Vascular Doppler flow is present within this material and there is vascular Doppler flow within an apparent stalk extending towards the myometrium along the anterosuperior aspect, best visualized on the cine images. IMPRESSION: No intrauterine pregnancy. Heterogeneous material within the lower endometrium, measuring 2 x 1.2 x 4 cm, worrisome for retained products of conception. Of note, there is vascular Doppler flow within an apparent tissue stalk extending towards the myometrium along the anterosuperior aspect of this tissue, best visualized on the cine images. Gynecologic consultation recommended. Electronically Signed   By: Rance Burrows M.D.   On: 09/05/2023 15:13   US  OB Transvaginal Result Date: 09/05/2023 CLINICAL DATA:  post-chem abortion hemorrhage EXAM: OBSTETRIC <14 WK US  AND TRANSVAGINAL OB US  TECHNIQUE: Both transabdominal and transvaginal ultrasound examinations were performed for complete evaluation of the gestation as well as the maternal uterus, adnexal regions, and pelvic cul-de-sac. Transvaginal technique was performed to assess early pregnancy. COMPARISON:  Jul 28, 2023 FINDINGS: Intrauterine gestational sac: Not present, at this time. Yolk sac:  Not present, at this time. Fetal Pole:  Not present, at this time. Cardiac Activity: Not present, at this time. Subchorionic hemorrhage:  None visualized. Maternal uterus/adnexae: The right ovary measures 3.7 x 1.9 x 2.9 cm. The left ovary measures 3.2 x 2.3 x 2.5 cm. Heterogeneous material within the lower endometrium, measuring 2 x 1.2 x 4 cm. Vascular Doppler flow is present within this material and there is vascular Doppler flow within an apparent stalk extending towards the myometrium along the anterosuperior aspect, best visualized on the cine images. IMPRESSION: No intrauterine pregnancy. Heterogeneous material  within the lower endometrium, measuring 2 x 1.2 x 4 cm, worrisome for retained products of conception. Of note, there is vascular Doppler flow within an apparent tissue stalk extending towards the myometrium  along the anterosuperior aspect of this tissue, best visualized on the cine images. Gynecologic consultation recommended. Electronically Signed   By: Rance Burrows M.D.   On: 09/05/2023 15:13    Procedures Procedures    Medications Ordered in ED Medications  sodium chloride  0.9 % bolus 1,000 mL (0 mLs Intravenous Stopped 09/05/23 1623)  acetaminophen  (TYLENOL ) tablet 1,000 mg (1,000 mg Oral Given 09/05/23 1611)  cefTRIAXone  (ROCEPHIN ) 1 g in sodium chloride  0.9 % 100 mL IVPB (0 g Intravenous Stopped 09/05/23 1640)    ED Course/ Medical Decision Making/ A&P Clinical Course as of 09/05/23 1756  Tue Sep 05, 2023  1534 WBC(!): 11.8 Mild leukocytosis is noted [JG]  1534 US  OB Comp < 14 Wks Noted retained products of conception.  Gynecology consultation placed. [JG]  1555 Note obtained from nurse that patient experienced 6/10 headache.  Will manage initially with acetaminophen . [JG]    Clinical Course User Index [JG] Juanetta Nordmann, PA                                 Medical Decision Making Amount and/or Complexity of Data Reviewed Labs: ordered. Decision-making details documented in ED Course. Radiology: ordered. Decision-making details documented in ED Course.  Risk OTC drugs. Prescription drug management.   Medical Decision Making:   Yvonne Carlson is a 28 y.o. female who presented to the ED today with dizziness as well as intermittent abdominal discomfort and lower back pain detailed above.    Additional history discussed with patient's family/caregivers.  External chart has been reviewed including urgent care records. Complete initial physical exam performed, notably the patient  was alert and oriented in no apparent distress.  Notably she did have tenderness to the  right lower quadrant with palpation.  Otherwise cardiac and pulmonary auscultation are unremarkable.  Bowel sounds are present and normal.      Reviewed and confirmed nursing documentation for past medical history, family history, social history.    Initial Assessment:   With the patient's presentation of lower abdominal pain and dizziness, most likely diagnosis is possible retained products of conception/incomplete abortion.. Other diagnoses were considered including (but not limited to) anemia. These are considered less likely due to history of present illness and physical exam findings.     Initial Plan:  Obtain ultrasound of the pelvis to assess for presence of products of conception. Screening labs including CBC and Metabolic panel to evaluate for infectious or metabolic etiology of disease.  Urinalysis with reflex culture ordered to evaluate for UTI or relevant urologic/nephrologic pathology.  Consider CT scan of the abdomen/pelvis to assess for intra-abdominal pathology Objective evaluation as below reviewed   Initial Study Results:   Laboratory  All laboratory results reviewed without evidence of clinically relevant pathology.   Exceptions include: hCG is increased at 1199.  Leukocytosis of 11.8 .    Radiology:  All images reviewed independently. Agree with radiology report at this time.   US  OB Comp < 14 Wks Result Date: 09/05/2023 CLINICAL DATA:  post-chem abortion hemorrhage EXAM: OBSTETRIC <14 WK US  AND TRANSVAGINAL OB US  TECHNIQUE: Both transabdominal and transvaginal ultrasound examinations were performed for complete evaluation of the gestation as well as the maternal uterus, adnexal regions, and pelvic cul-de-sac. Transvaginal technique was performed to assess early pregnancy. COMPARISON:  Jul 28, 2023 FINDINGS: Intrauterine gestational sac: Not present, at this time. Yolk sac:  Not present, at this time. Fetal Pole:  Not present, at this time. Cardiac Activity: Not present, at  this time. Subchorionic hemorrhage:  None visualized. Maternal uterus/adnexae: The right ovary measures 3.7 x 1.9 x 2.9 cm. The left ovary measures 3.2 x 2.3 x 2.5 cm. Heterogeneous material within the lower endometrium, measuring 2 x 1.2 x 4 cm. Vascular Doppler flow is present within this material and there is vascular Doppler flow within an apparent stalk extending towards the myometrium along the anterosuperior aspect, best visualized on the cine images. IMPRESSION: No intrauterine pregnancy. Heterogeneous material within the lower endometrium, measuring 2 x 1.2 x 4 cm, worrisome for retained products of conception. Of note, there is vascular Doppler flow within an apparent tissue stalk extending towards the myometrium along the anterosuperior aspect of this tissue, best visualized on the cine images. Gynecologic consultation recommended. Electronically Signed   By: Rance Burrows M.D.   On: 09/05/2023 15:13   US  OB Transvaginal Result Date: 09/05/2023 CLINICAL DATA:  post-chem abortion hemorrhage EXAM: OBSTETRIC <14 WK US  AND TRANSVAGINAL OB US  TECHNIQUE: Both transabdominal and transvaginal ultrasound examinations were performed for complete evaluation of the gestation as well as the maternal uterus, adnexal regions, and pelvic cul-de-sac. Transvaginal technique was performed to assess early pregnancy. COMPARISON:  Jul 28, 2023 FINDINGS: Intrauterine gestational sac: Not present, at this time. Yolk sac:  Not present, at this time. Fetal Pole:  Not present, at this time. Cardiac Activity: Not present, at this time. Subchorionic hemorrhage:  None visualized. Maternal uterus/adnexae: The right ovary measures 3.7 x 1.9 x 2.9 cm. The left ovary measures 3.2 x 2.3 x 2.5 cm. Heterogeneous material within the lower endometrium, measuring 2 x 1.2 x 4 cm. Vascular Doppler flow is present within this material and there is vascular Doppler flow within an apparent stalk extending towards the myometrium along the  anterosuperior aspect, best visualized on the cine images. IMPRESSION: No intrauterine pregnancy. Heterogeneous material within the lower endometrium, measuring 2 x 1.2 x 4 cm, worrisome for retained products of conception. Of note, there is vascular Doppler flow within an apparent tissue stalk extending towards the myometrium along the anterosuperior aspect of this tissue, best visualized on the cine images. Gynecologic consultation recommended. Electronically Signed   By: Rance Burrows M.D.   On: 09/05/2023 15:13      Consults: Case discussed with Dr Vallarie Gauze from gynecology; discussed case, she suggested either intravaginal misoprostol or D&C to be done outpatient tomorrow.   Reassessment and Plan:   Based on ultrasound findings of retained products of conception, consulted with gynecology who as noted, offered managing with either intravaginal misoprostol or with dilation curettage to be done outpatient.  Discussed this with the patient and they are opting for dilation and curettage, discussed with patient that this to be done as an outpatient procedure.  Thorough discussion had with both the patient and her mother in law who are initially insistent for procedure to be done emergently today.  Again discussed with patient that per discussion with specialist, this is to be done outpatient, based on findings and presentation, low risk for complications given her presentation.  Managed empirically with IV ceftriaxone  due to leukocytosis.  Discussed this case with Brunilda Capra, DO who had discussion with patient as noted in his additional notes to this case.  At this time patient is agreeable to discharge and outpatient management, outpatient referrals noted.          Final Clinical Impression(s) / ED Diagnoses Final diagnoses:  Retained products of conception  following abortion    Rx / DC Orders ED Discharge Orders          Ordered    Ambulatory Referral For Surgery Scheduling       Comments:  Requesting Surgeon: First available Assist: None Preferred Assist: N/A Date Requests: ASAP CPT Code:D&E (first trimester) - 82956 Patient Class: Outpatient Preferred Location: [facility]  Insurance Type: Medicaid (including secondary)  Consent Signed: Not required for this procedure Schedule post-op in office 2 weeks after surgery  Lacey Pian, MD   09/05/23 1557    oxyCODONE  (ROXICODONE ) 5 MG immediate release tablet  Every 4 hours PRN        09/05/23 1753    ondansetron  (ZOFRAN ) 4 MG tablet  Every 4 hours PRN        09/05/23 1753              Juanetta Nordmann, PA 09/05/23 1800    Teddi Favors, DO 09/13/23 2130

## 2023-09-05 NOTE — ED Notes (Signed)
 Pt presents tearful with tremors, pt verbalized to this nurse and mother at bedside that she is "scared of being discharged and scared of falling asleep and not waking up." Pt stated "how are you guys sure the infection isnt going to get worst". Pt stated "how are you sure the doctor office is going to call me and schedule me in". This nurse took time to console pt and mother and advised them that the abx being given is to treat the suspected infection and to try to prevent sepsis from occurring. Pt seems to have settled down but still seems anxious.

## 2023-09-05 NOTE — Consult Note (Signed)
   OB/GYN Telephone Consult  Yvonne Carlson is a 28 y.o. [id number] presenting with bleeding and clots 3 weeks after EAB with medications.    I was called for a consult regarding the care of this patient by [facility].    The provider had a clinical question regarding management options for RPOC.   The provider presented the following relevant clinical information and I performed a chart review on the patient and reviewed available documentation:  Patient is a 28 yo 03/8 s/p  medication assisted EAB who presented with bleeding and some clots. HgB is 13.3 and HCG is 1199. She had an TVUS in the ED while there which reported no GS and now thickened EL about 2 cm concerning for RPOC.   I reviewed her pelvic ultrasound images independently and my findings are: Concerning for RPOC - thickened EL about 2 cm. Has some doppler flow. Also possibly blood clots but given presence of doppler flow, I also suspect RPOC.  I reviewed CareEverywhere: 07/27/23 was positive UPT at Urgent Care through [facility].   BP (!) 119/98 (BP Location: Left Arm)   Pulse 88   Temp 99.1 F (37.3 C) (Oral)   Resp 20   Ht 4\' 9"  (1.448 m)   Wt 63.5 kg   SpO2 100%   BMI 30.30 kg/m   Exam- performed by consulting provider   Recommendations:  - RPOC following medication EAB from 3 weeks ago. Has 2 options - may try repeat miso 800 mcg vaginally vs proceeding with D&E. ER physician reviewed with pt and she would like D&E. Urgent referral sent to scheduler.  - Will set up 2 week follow up for her at [facility] for follow up and also to discuss birth control.    Thank you for this consult and if additional recommendations are needed please call [phone] for the OB/GYN attending on service at [facility].   I spent approximately 5 minutes directly consulting with the provider and verbally discussing this case. Additionally 10 minutes minutes was spent performing chart review and documentation.   Lacey Pian, MD Attending  Obstetrician & Gynecologist, [facility] for [facility], [facility]

## 2023-09-05 NOTE — ED Triage Notes (Signed)
 Pt c/o intermittent low back pain x a couple days, diarrhea starting today, and vaginal bleeding r/t chemical abortion x3 weeks ago.  Pt seen by UC for same and sent to the ED.  Pt reports "they told me they are still pregnant."

## 2023-09-05 NOTE — ED Notes (Signed)
 Patient is being discharged from the Urgent Care and sent to the Emergency Department via POV . Per Ramonita Burow, NP, patient is in need of higher level of care due to needing further evaluation. Patient is aware and verbalizes understanding of plan of care.  Vitals:   09/05/23 1031  BP: (!) 134/92  Pulse: 86  Resp: 17  Temp: 97.8 F (36.6 C)  SpO2: 98%

## 2023-09-05 NOTE — ED Provider Notes (Addendum)
 3:02 PM Called to the room by patient's relative over concerns related to patient care Relative identified herself as pt's in-law and the patient was one of her grandbabies. Santana Cue concerned that the patient had not been receiving appropriate care, she was not given a call bell (call bell was noted in the room), she had not been given her IV fluids yet (IV fluid supplies in the room, fluids were delayed as pt was sent to ultrasound), and the nurse had not been in the room to check in on the patient. I addressed the patient's family members concerns will all due courtesy and the patient's concerns related to her care today - primarily not yet receiving her IV fluids.   I discussed the current step of the patient's assessment/ treatment in the ED.     5:47 PM Discussed plan at length with patient and family at bedside Plan per Dr Vallarie Gauze is for outpatient d/c, discussed this with patient and family at length They are agreeable to plan Advised upon strict return precautions Are requesting outpatient counseling resources regarding pt's mental health following abortion, these were provided  Hgb is 13.3- similar to prior Bleeding has improved  Wbc mild elev, not septic  Pt is HDS, well appearing overall, tolerating PO. Stable for dc w/ o/p OBGYN management per plan Dr Keren Peasant, Jola Nash, DO 09/05/23 1749  ADDENDUM >> Denese Finn participated in aforementioned conversation at 3:02PM to help address pt and family concerns regarding encounter in the ED today.    Teddi Favors, DO 09/13/23 [phone]

## 2023-09-05 NOTE — Discharge Instructions (Signed)
 Please go to the emergency room for further evaluation of your symptoms

## 2023-09-06 ENCOUNTER — Telehealth: Payer: Self-pay | Admitting: Obstetrics and Gynecology

## 2023-09-06 ENCOUNTER — Encounter (HOSPITAL_COMMUNITY): Payer: Self-pay | Admitting: Obstetrics & Gynecology

## 2023-09-06 DIAGNOSIS — O034 Incomplete spontaneous abortion without complication: Secondary | ICD-10-CM

## 2023-09-06 HISTORY — DX: Incomplete spontaneous abortion without complication: O03.4

## 2023-09-06 NOTE — Progress Notes (Signed)
 Spoke w/ via phone for pre-op interview--- pt Lab needs dos----    no (per anes)    Lab results------ current labs in epic dated 09-06-2023 CBC/ CMP (no Rh) COVID test -----patient states asymptomatic no test needed Arrive at ------- 0915 on 09-07-2023 NPO after MN absolutely nothing by mouth after midnight including water, candy, gum, mints Explained to patient complications and reasons why due to receiving anesthesia since she has never had surgery before, pt verbalized understanding Pre-Surgery Ensure or G2: n/a  Med rec completed Medications to take morning of surgery ----- none Diabetic medication ----- n/a  GLP1 agonist last dose: n/a GLP1 instructions:  Patient instructed no nail polish to be worn day of surgery Patient instructed to bring photo id and insurance card day of surgery Patient aware to have Driver (ride ) / caregiver    for 24 hours after surgery -  boyfriend, marlon brooks Patient Special Instructions ----- soap shower morning of surgery Pre-Op special Instructions ----- case just added on today,  pre-op orders pending  Patient verbalized understanding of instructions that were given at this phone interview. Patient denies chest pain, sob, fever, cough at the interview.

## 2023-09-06 NOTE — Telephone Encounter (Signed)
 Patient notified of surgery date/time instructions for 09/07/23 to arrive at 9am to [facility] entrance A and to be NPO.

## 2023-09-07 ENCOUNTER — Other Ambulatory Visit: Payer: Self-pay | Admitting: Obstetrics & Gynecology

## 2023-09-07 ENCOUNTER — Ambulatory Visit (HOSPITAL_COMMUNITY)

## 2023-09-07 ENCOUNTER — Other Ambulatory Visit: Payer: Self-pay

## 2023-09-07 ENCOUNTER — Ambulatory Visit (HOSPITAL_COMMUNITY)
Admission: RE | Admit: 2023-09-07 | Discharge: 2023-09-07 | Disposition: A | Discharge status: Home / Self Care | Attending: Obstetrics & Gynecology | Admitting: Obstetrics & Gynecology

## 2023-09-07 ENCOUNTER — Encounter (HOSPITAL_COMMUNITY): Payer: Self-pay | Admitting: Obstetrics & Gynecology

## 2023-09-07 ENCOUNTER — Encounter (HOSPITAL_COMMUNITY)
Admission: RE | Disposition: A | Discharge status: Home / Self Care | Payer: Self-pay | Source: Home / Self Care | Attending: Obstetrics & Gynecology

## 2023-09-07 DIAGNOSIS — F32A Depression, unspecified: Secondary | ICD-10-CM | POA: Diagnosis not present

## 2023-09-07 DIAGNOSIS — O034 Incomplete spontaneous abortion without complication: Secondary | ICD-10-CM

## 2023-09-07 DIAGNOSIS — O074 Failed attempted termination of pregnancy without complication: Secondary | ICD-10-CM

## 2023-09-07 DIAGNOSIS — Z3A01 Less than 8 weeks gestation of pregnancy: Secondary | ICD-10-CM

## 2023-09-07 DIAGNOSIS — F419 Anxiety disorder, unspecified: Secondary | ICD-10-CM | POA: Insufficient documentation

## 2023-09-07 DIAGNOSIS — Z01818 Encounter for other preprocedural examination: Secondary | ICD-10-CM

## 2023-09-07 DIAGNOSIS — Z3A00 Weeks of gestation of pregnancy not specified: Secondary | ICD-10-CM | POA: Diagnosis not present

## 2023-09-07 DIAGNOSIS — Z3A08 8 weeks gestation of pregnancy: Secondary | ICD-10-CM | POA: Diagnosis not present

## 2023-09-07 DIAGNOSIS — O071 Delayed or excessive hemorrhage following failed attempted termination of pregnancy: Secondary | ICD-10-CM | POA: Diagnosis present

## 2023-09-07 HISTORY — DX: Nonscarring hair loss, unspecified: L65.9

## 2023-09-07 HISTORY — DX: Presence of spectacles and contact lenses: Z97.3

## 2023-09-07 HISTORY — PX: DILATION AND EVACUATION: SHX1459

## 2023-09-07 LAB — TYPE AND SCREEN
ABO/RH(D): O POS
Antibody Screen: NEGATIVE

## 2023-09-07 LAB — ABO/RH: ABO/RH(D): O POS

## 2023-09-07 SURGERY — DILATION AND EVACUATION, UTERUS
Anesthesia: General | Site: Uterus

## 2023-09-07 MED ORDER — POVIDONE-IODINE 10 % EX SWAB: 2.0000 | Freq: Once | CUTANEOUS | Status: DC

## 2023-09-07 MED ORDER — GLYCOPYRROLATE PF 0.2 MG/ML IJ SOSY
PREFILLED_SYRINGE | INTRAMUSCULAR | Status: AC
  Filled 2023-09-07: qty 1

## 2023-09-07 MED ORDER — HYDROMORPHONE HCL 1 MG/ML IJ SOLN: 0.2500 mg | INTRAMUSCULAR | Status: DC | PRN

## 2023-09-07 MED ORDER — LIDOCAINE 2% (20 MG/ML) 5 ML SYRINGE
INTRAMUSCULAR | Status: DC | PRN
  Administered 2023-09-07: 60 mg via INTRAVENOUS

## 2023-09-07 MED ORDER — LACTATED RINGERS IV SOLN: INTRAVENOUS | Status: DC

## 2023-09-07 MED ORDER — BUPIVACAINE HCL (PF) 0.5 % IJ SOLN
INTRAMUSCULAR | Status: DC | PRN
Start: 2023-09-07 — End: 2023-09-07
  Administered 2023-09-07: 10 mL

## 2023-09-07 MED ORDER — CHLORHEXIDINE GLUCONATE 0.12 % MT SOLN
OROMUCOSAL | Status: AC
  Filled 2023-09-07: qty 15

## 2023-09-07 MED ORDER — MEPERIDINE HCL 25 MG/ML IJ SOLN: 6.2500 mg | INTRAMUSCULAR | Status: DC | PRN

## 2023-09-07 MED ORDER — MIDAZOLAM HCL 2 MG/2ML IJ SOLN
INTRAMUSCULAR | Status: DC | PRN
  Administered 2023-09-07: 2 mg via INTRAVENOUS

## 2023-09-07 MED ORDER — ONDANSETRON HCL 4 MG/2ML IJ SOLN
INTRAMUSCULAR | Status: DC | PRN
  Administered 2023-09-07: 4 mg via INTRAVENOUS

## 2023-09-07 MED ORDER — PROPOFOL 10 MG/ML IV BOLUS
INTRAVENOUS | Status: DC | PRN
  Administered 2023-09-07: 200 mg via INTRAVENOUS

## 2023-09-07 MED ORDER — MIDAZOLAM HCL 2 MG/2ML IJ SOLN
INTRAMUSCULAR | Status: AC
  Filled 2023-09-07: qty 2

## 2023-09-07 MED ORDER — FENTANYL CITRATE (PF) 250 MCG/5ML IJ SOLN
INTRAMUSCULAR | Status: DC | PRN
  Administered 2023-09-07 (×3): 50 ug via INTRAVENOUS

## 2023-09-07 MED ORDER — KETOROLAC TROMETHAMINE 30 MG/ML IJ SOLN
INTRAMUSCULAR | Status: AC
  Filled 2023-09-07: qty 1

## 2023-09-07 MED ORDER — PROPOFOL 10 MG/ML IV BOLUS
INTRAVENOUS | Status: AC
Start: 2023-09-07 — End: 2023-09-07
  Filled 2023-09-07: qty 20

## 2023-09-07 MED ORDER — DOXYCYCLINE HYCLATE 100 MG IV SOLR
200.0000 mg | INTRAVENOUS | Status: AC
  Administered 2023-09-07: 200 mg via INTRAVENOUS
  Filled 2023-09-07: qty 100

## 2023-09-07 MED ORDER — ORAL CARE MOUTH RINSE: 15.0000 mL | Freq: Once | OROMUCOSAL | Status: AC

## 2023-09-07 MED ORDER — DEXAMETHASONE SODIUM PHOSPHATE 10 MG/ML IJ SOLN
INTRAMUSCULAR | Status: DC | PRN
  Administered 2023-09-07: 10 mg via INTRAVENOUS

## 2023-09-07 MED ORDER — AMISULPRIDE (ANTIEMETIC) 5 MG/2ML IV SOLN: 10.0000 mg | Freq: Once | INTRAVENOUS | Status: DC | PRN

## 2023-09-07 MED ORDER — FENTANYL CITRATE (PF) 250 MCG/5ML IJ SOLN
INTRAMUSCULAR | Status: AC
  Filled 2023-09-07: qty 5

## 2023-09-07 MED ORDER — CHLORHEXIDINE GLUCONATE 0.12 % MT SOLN
15.0000 mL | Freq: Once | OROMUCOSAL | Status: AC
Start: 2023-09-07 — End: 2023-09-07
  Administered 2023-09-07: 15 mL via OROMUCOSAL

## 2023-09-07 MED ORDER — OXYCODONE HCL 5 MG/5ML PO SOLN: 5.0000 mg | Freq: Once | ORAL | Status: DC | PRN

## 2023-09-07 MED ORDER — SODIUM CHLORIDE 0.9 % IV SOLN
12.5000 mg | INTRAVENOUS | Status: DC | PRN
  Filled 2023-09-07: qty 0.5

## 2023-09-07 MED ORDER — LIDOCAINE 2% (20 MG/ML) 5 ML SYRINGE
INTRAMUSCULAR | Status: AC
  Filled 2023-09-07: qty 5

## 2023-09-07 MED ORDER — DEXAMETHASONE SODIUM PHOSPHATE 10 MG/ML IJ SOLN
INTRAMUSCULAR | Status: AC
  Filled 2023-09-07: qty 1

## 2023-09-07 MED ORDER — OXYCODONE HCL 5 MG PO TABS: 5.0000 mg | ORAL_TABLET | Freq: Once | ORAL | Status: DC | PRN

## 2023-09-07 MED ORDER — ONDANSETRON HCL 4 MG/2ML IJ SOLN
INTRAMUSCULAR | Status: AC
  Filled 2023-09-07: qty 2

## 2023-09-07 MED ORDER — KETOROLAC TROMETHAMINE 30 MG/ML IJ SOLN
INTRAMUSCULAR | Status: DC | PRN
Start: 2023-09-07 — End: 2023-09-07
  Administered 2023-09-07: 30 mg via INTRAVENOUS

## 2023-09-07 SURGICAL SUPPLY — 18 items
CATH ROBINSON RED A/P 16FR (CATHETERS) ×2 IMPLANT
FILTER UTR ASPR ASSEMBLY (MISCELLANEOUS) ×2 IMPLANT
GLOVE BIO SURGEON STRL SZ 6.5 (GLOVE) ×2 IMPLANT
GLOVE BIOGEL PI IND STRL 7.0 (GLOVE) ×4 IMPLANT
GLOVE SURG SS PI 7.0 STRL IVOR (GLOVE) IMPLANT
GOWN STRL REUS W/ TWL LRG LVL3 (GOWN DISPOSABLE) ×4 IMPLANT
GOWN STRL SURGICAL XL XLNG (GOWN DISPOSABLE) IMPLANT
HOSE CONNECTING 18IN BERKELEY (TUBING) ×2 IMPLANT
KIT BERKELEY 1ST TRI 3/8 NO TR (MISCELLANEOUS) ×2 IMPLANT
KIT BERKELEY 1ST TRIMESTER 3/8 (MISCELLANEOUS) ×2 IMPLANT
NS IRRIG 1000ML POUR BTL (IV SOLUTION) ×2 IMPLANT
PACK VAGINAL MINOR WOMEN LF (CUSTOM PROCEDURE TRAY) ×2 IMPLANT
PAD OB MATERNITY 11 LF (PERSONAL CARE ITEMS) ×2 IMPLANT
SET BERKELEY SUCTION TUBING (SUCTIONS) IMPLANT
SPIKE FLUID TRANSFER (MISCELLANEOUS) IMPLANT
TOWEL GREEN STERILE FF (TOWEL DISPOSABLE) ×4 IMPLANT
UNDERPAD 30X36 HEAVY ABSORB (UNDERPADS AND DIAPERS) ×2 IMPLANT
VACURETTE 8 RIGID CVD (CANNULA) IMPLANT

## 2023-09-07 NOTE — Anesthesia Postprocedure Evaluation (Signed)
 Anesthesia Post Note  Patient: Yvonne Carlson  Procedure(s) Performed: DILATION AND EVACUATION, UTERUS (Uterus)     Patient location during evaluation: PACU Anesthesia Type: General Level of consciousness: awake and alert Pain management: pain level controlled Vital Signs Assessment: post-procedure vital signs reviewed and stable Respiratory status: spontaneous breathing, nonlabored ventilation and respiratory function stable Cardiovascular status: blood pressure returned to baseline and stable Postop Assessment: no apparent nausea or vomiting Anesthetic complications: no   No notable events documented.  Last Vitals:  Vitals:   09/07/23 1211 09/07/23 1245  BP: 127/74 119/69  Pulse: 79   Resp: 16 16  Temp: 36.8 C   SpO2: 100%     Last Pain:  Vitals:   09/07/23 1245  TempSrc:   PainSc: 0-No pain                 Earvin Goldberg

## 2023-09-07 NOTE — Anesthesia Preprocedure Evaluation (Signed)
Anesthesia Evaluation  Patient identified by MRN, date of birth, ID band Patient awake    Reviewed: Allergy & Precautions, H&P , NPO status , Patient's Chart, lab work & pertinent test results  Airway Mallampati: II  TM Distance: >3 FB Neck ROM: Full    Dental no notable dental hx.    Pulmonary neg pulmonary ROS   Pulmonary exam normal breath sounds clear to auscultation       Cardiovascular negative cardio ROS Normal cardiovascular exam Rhythm:Regular Rate:Normal     Neuro/Psych   Anxiety Depression    negative neurological ROS  negative psych ROS   GI/Hepatic negative GI ROS, Neg liver ROS,,,  Endo/Other  negative endocrine ROS    Renal/GU negative Renal ROS  negative genitourinary   Musculoskeletal negative musculoskeletal ROS (+)    Abdominal   Peds negative pediatric ROS (+)  Hematology negative hematology ROS (+)   Anesthesia Other Findings   Reproductive/Obstetrics negative OB ROS                             Anesthesia Physical Anesthesia Plan  ASA: 2  Anesthesia Plan: General   Post-op Pain Management:    Induction: Intravenous  PONV Risk Score and Plan: 3 and Ondansetron, Dexamethasone, Midazolam and Treatment may vary due to age or medical condition  Airway Management Planned: LMA  Additional Equipment:   Intra-op Plan:   Post-operative Plan: Extubation in OR  Informed Consent: I have reviewed the patients History and Physical, chart, labs and discussed the procedure including the risks, benefits and alternatives for the proposed anesthesia with the patient or authorized representative who has indicated his/her understanding and acceptance.     Dental advisory given  Plan Discussed with: CRNA  Anesthesia Plan Comments:        Anesthesia Quick Evaluation

## 2023-09-07 NOTE — Transfer of Care (Signed)
 Immediate Anesthesia Transfer of Care Note  Patient: Yvonne Carlson  Procedure(s) Performed: DILATION AND EVACUATION, UTERUS (Uterus)  Patient Location: PACU  Anesthesia Type:General  Level of Consciousness: awake, alert , oriented, patient cooperative, and responds to stimulation  Airway & Oxygen Therapy: Patient Spontanous Breathing and Patient connected to nasal cannula oxygen  Post-op Assessment: Report given to RN and Post -op Vital signs reviewed and stable  Post vital signs: Reviewed and stable  Last Vitals:  Vitals Value Taken Time  BP 139/106 09/07/23 12:15  Temp    Pulse 95 09/07/23 12:15  Resp 12 09/07/23 12:16  SpO2 100 % 09/07/23 12:15  Vitals shown include unfiled device data.  Last Pain:  Vitals:   09/07/23 0947  TempSrc: Oral         Complications: No notable events documented.

## 2023-09-07 NOTE — Discharge Instructions (Addendum)
 DO NOT TAKE MOTRIN  UNTIL AFTER 6PM   Post Anesthesia Home Care Instructions  Activity: Get plenty of rest for the remainder of the day. A responsible adult should stay with you for 24 hours following the procedure.  For the next 24 hours, DO NOT: -Drive a car -Advertising copywriter -Drink alcoholic beverages -Take any medication unless instructed by your physician -Make any legal decisions or sign important papers.  Meals: Start with liquid foods such as gelatin or soup. Progress to regular foods as tolerated. Avoid greasy, spicy, heavy foods. If nausea and/or vomiting occur, drink only clear liquids until the nausea and/or vomiting subsides. Call your physician if vomiting continues.  Special Instructions/Symptoms: Your throat may feel dry or sore from the anesthesia or the breathing tube placed in your throat during surgery. If this causes discomfort, gargle with warm salt water. The discomfort should disappear within 24 hours.  If you had a scopolamine patch placed behind your ear for the management of post- operative nausea and/or vomiting:  1. The medication in the patch is effective for 72 hours, after which it should be removed.  Wrap patch in a tissue and discard in the trash. Wash hands thoroughly with soap and water. 2. You may remove the patch earlier than 72 hours if you experience unpleasant side effects which may include dry mouth, dizziness or visual disturbances. 3. Avoid touching the patch. Wash your hands with soap and water after contact with the patch.  Call your surgeon if you experience:   1.  Fever over 101.0. 2.  Inability to urinate. 3.  Nausea and/or vomiting. 4.  Extreme swelling or bruising at the surgical site. 5.  Continued bleeding from the incision. 6.  Increased pain, redness or drainage from the incision. 7.  Problems related to your pain medication. 8. Any change in color, movement and/or sensation 9. Any problems and/or concerns

## 2023-09-07 NOTE — Anesthesia Procedure Notes (Signed)
 Procedure Name: LMA Insertion Date/Time: 09/07/2023 11:34 AM  Performed by: Ballard Levels, CRNAPre-anesthesia Checklist: Patient identified, Emergency Drugs available, Suction available and Patient being monitored Patient Re-evaluated:Patient Re-evaluated prior to induction Oxygen Delivery Method: Circle System Utilized Preoxygenation: Pre-oxygenation with 100% oxygen Induction Type: IV induction Ventilation: Mask ventilation without difficulty LMA: LMA inserted LMA Size: 4.0 Number of attempts: 1 Placement Confirmation: positive ETCO2 Tube secured with: Tape Dental Injury: Teeth and Oropharynx as per pre-operative assessment

## 2023-09-07 NOTE — Op Note (Signed)
 Yvonne Carlson PROCEDURE DATE: 09/07/2023  PREOPERATIVE DIAGNOSIS: 8 week incomplete abortion POSTOPERATIVE DIAGNOSIS: The same PROCEDURE:     Dilation and Evacuation SURGEON:  Onnie Bilis MD  INDICATIONS: 28 y.o. G0P0000 with IAB at [redacted] weeks gestation, needing surgical completion.  Risks of surgery were discussed with the patient including but not limited to: bleeding which may require transfusion; infection which may require antibiotics; injury to uterus or surrounding organs; need for additional procedures including laparotomy or laparoscopy; possibility of intrauterine scarring which may impair future fertility; and other postoperative/anesthesia complications. Written informed consent was obtained.    FINDINGS:  A 8 week size uterus, moderate amounts of products of conception, specimen sent to pathology.  ANESTHESIA:    Monitored intravenous sedation, paracervical block. INTRAVENOUS FLUIDS:  500 ml of LR ESTIMATED BLOOD LOSS:  Less than 10 ml. SPECIMENS:  Products of conception sent to pathology COMPLICATIONS:  None immediate.  PROCEDURE DETAILS:  The patient received intravenous Doxycycline while in the preoperative area.  She was then taken to the operating room where monitored intravenous sedation was administered and was found to be adequate.  After an adequate timeout was performed, she was placed in the dorsal lithotomy position and examined; then prepped and draped in the sterile manner.   Her bladder was catheterized for an unmeasured amount of clear, yellow urine. A vaginal speculum was then placed in the patient's vagina and a single tooth tenaculum was applied to the anterior lip of the cervix.  A paracervical block using 30 ml of 0.5% Marcaine was administered. The cervix was gently dilated to accommodate a 8 mm suction curette that was gently advanced to the uterine fundus.  The suction device was then activated and curette slowly rotated to clear the uterus of products of  conception.  There was minimal bleeding noted and the tenaculum removed with good hemostasis noted.   All instruments were removed from the patient's vagina.  Sponge and instrument counts were correct times two  The patient tolerated the procedure well and was taken to the recovery area awake, and in stable condition.  Tresia Fruit, MD 09/07/2023 12:00 PM

## 2023-09-07 NOTE — H&P (Signed)
 Yvonne Carlson is an 28 y.o. female. Yvonne Carlson is a 28 y.o. G0P0000 presenting with bleeding and clots 3 weeks after EAB with medications.    RPOC following medication EAB from 3 weeks ago , she is posted for [facility] today Pertinent Gynecological History:  Menstrual History:  No LMP recorded.    Past Medical History:  Diagnosis Date   Alopecia    Anxiety    Depression    Retained products of conception following abortion 09/06/2023   EAB with medications   Wears glasses     History reviewed. No pertinent surgical history.  Family History  Problem Relation Age of Onset   Hypertension Maternal Grandmother    Diabetes Maternal Grandmother     Social History:  reports that she has never smoked. She has never used smokeless tobacco. She reports that she does not currently use alcohol. She reports that she does not use drugs.  Allergies: No Known Allergies  Medications Prior to Admission  Medication Sig Dispense Refill Last Dose/Taking   acetaminophen  (TYLENOL ) 500 MG tablet Take 500-1,000 mg by mouth every 6 (six) hours as needed for headache, mild pain (pain score 1-3), moderate pain (pain score 4-6) or fever.   09/06/2023   ondansetron  (ZOFRAN ) 4 MG tablet Take 1 tablet (4 mg total) by mouth every 4 (four) hours as needed for nausea or vomiting. 6 tablet 0 Taking As Needed   oxyCODONE (ROXICODONE) 5 MG immediate release tablet Take 1 tablet (5 mg total) by mouth every 4 (four) hours as needed for severe pain (pain score 7-10). 10 tablet 0 Taking As Needed   triamcinolone  cream (KENALOG ) 0.5 % Apply 1 application. topically 2 (two) times daily. (Patient taking differently: Apply 1 application  topically 2 (two) times daily as needed.) 30 g 3 Unknown    Review of Systems  Genitourinary:  Positive for pelvic pain and vaginal bleeding.    Blood pressure 118/66, pulse 82, temperature 99 F (37.2 C), temperature source Oral, resp. rate 16, height 4' 9 (1.448 m), weight 59 kg, SpO2  100%, not currently breastfeeding. Physical Exam Vitals and nursing note reviewed. Exam conducted with a chaperone present.  Constitutional:      Appearance: Normal appearance.  HENT:     Head: Normocephalic and atraumatic.   Cardiovascular:     Rate and Rhythm: Normal rate.  Pulmonary:     Effort: Pulmonary effort is normal.  Abdominal:     General: Abdomen is flat.   Neurological:     Mental Status: She is alert.   Psychiatric:        Mood and Affect: Mood normal.        Behavior: Behavior normal.     Results for orders placed or performed during the hospital encounter of 09/07/23 (from the past 24 hours)  Type and screen     Status: None (Preliminary result)   Collection Time: 09/07/23 10:11 AM  Result Value Ref Range   ABO/RH(D) PENDING    Antibody Screen PENDING    Sample Expiration      09/10/2023,2359 Performed at [facility], [address], [place], [place] [address]   ABO/Rh     Status: None   Collection Time: 09/07/23 10:15 AM  Result Value Ref Range   ABO/RH(D)      O POS Performed at [facility], [address], [place], [place] [address]     US  OB Comp < 14 Wks Result Date: 09/05/2023 CLINICAL  DATA:  post-chem abortion hemorrhage EXAM: OBSTETRIC <14 WK US  AND TRANSVAGINAL OB US  TECHNIQUE: Both transabdominal and transvaginal ultrasound examinations were performed for complete evaluation of the gestation as well as the maternal uterus, adnexal regions, and pelvic cul-de-sac. Transvaginal technique was performed to assess early pregnancy. COMPARISON:  Jul 28, 2023 FINDINGS: Intrauterine gestational sac: Not present, at this time. Yolk sac:  Not present, at this time. Fetal Pole:  Not present, at this time. Cardiac Activity: Not present, at this time. Subchorionic hemorrhage:  None visualized. Maternal uterus/adnexae: The right ovary measures 3.7 x 1.9 x 2.9 cm. The left ovary measures 3.2 x 2.3 x 2.5 cm. Heterogeneous material within the lower  endometrium, measuring 2 x 1.2 x 4 cm. Vascular Doppler flow is present within this material and there is vascular Doppler flow within an apparent stalk extending towards the myometrium along the anterosuperior aspect, best visualized on the cine images. IMPRESSION: No intrauterine pregnancy. Heterogeneous material within the lower endometrium, measuring 2 x 1.2 x 4 cm, worrisome for retained products of conception. Of note, there is vascular Doppler flow within an apparent tissue stalk extending towards the myometrium along the anterosuperior aspect of this tissue, best visualized on the cine images. Gynecologic consultation recommended. Electronically Signed   By: Rance Burrows M.D.   On: 09/05/2023 15:13   US  OB Transvaginal Result Date: 09/05/2023 CLINICAL DATA:  post-chem abortion hemorrhage EXAM: OBSTETRIC <14 WK US  AND TRANSVAGINAL OB US  TECHNIQUE: Both transabdominal and transvaginal ultrasound examinations were performed for complete evaluation of the gestation as well as the maternal uterus, adnexal regions, and pelvic cul-de-sac. Transvaginal technique was performed to assess early pregnancy. COMPARISON:  Jul 28, 2023 FINDINGS: Intrauterine gestational sac: Not present, at this time. Yolk sac:  Not present, at this time. Fetal Pole:  Not present, at this time. Cardiac Activity: Not present, at this time. Subchorionic hemorrhage:  None visualized. Maternal uterus/adnexae: The right ovary measures 3.7 x 1.9 x 2.9 cm. The left ovary measures 3.2 x 2.3 x 2.5 cm. Heterogeneous material within the lower endometrium, measuring 2 x 1.2 x 4 cm. Vascular Doppler flow is present within this material and there is vascular Doppler flow within an apparent stalk extending towards the myometrium along the anterosuperior aspect, best visualized on the cine images. IMPRESSION: No intrauterine pregnancy. Heterogeneous material within the lower endometrium, measuring 2 x 1.2 x 4 cm, worrisome for retained products of  conception. Of note, there is vascular Doppler flow within an apparent tissue stalk extending towards the myometrium along the anterosuperior aspect of this tissue, best visualized on the cine images. Gynecologic consultation recommended. Electronically Signed   By: Rance Burrows M.D.   On: 09/05/2023 15:13    Assessment/Plan: Retained poc after medical termination of pregnancy for surgical completion. .  The risks of surgery were discussed in detail with the patient including but not limited to: bleeding which may require transfusion or reoperation; infection which may require prolonged hospitalization or re-hospitalization and antibiotic therapy; injury to bowel, bladder, ureters and major vessels or other surrounding organs which may lead to other procedures; formation of adhesions; need for additional procedures including laparotomy or subsequent procedures secondary to intraoperative injury or abnormal pathology; thromboembolic phenomenon; incisional problems and other postoperative or anesthesia complications.  Patient was told that the likelihood that her condition and symptoms will be treated effectively with this surgical management was very high; the postoperative expectations were also discussed in detail. The patient also understands the alternative treatment options which  were discussed in full. All questions were answered. Onnie Bilis 09/07/2023, 11:11 AM

## 2023-09-08 ENCOUNTER — Encounter (HOSPITAL_COMMUNITY): Payer: Self-pay | Admitting: Obstetrics & Gynecology

## 2023-09-08 LAB — SURGICAL PATHOLOGY

## 2023-09-26 ENCOUNTER — Encounter: Payer: Self-pay | Admitting: Obstetrics and Gynecology

## 2023-09-26 ENCOUNTER — Ambulatory Visit: Admitting: Obstetrics and Gynecology

## 2023-09-26 VITALS — BP 112/73 | HR 65 | Ht <= 58 in | Wt 129.0 lb

## 2023-09-26 DIAGNOSIS — Z9889 Other specified postprocedural states: Secondary | ICD-10-CM | POA: Diagnosis not present

## 2023-09-26 DIAGNOSIS — R5383 Other fatigue: Secondary | ICD-10-CM

## 2023-09-26 DIAGNOSIS — Z309 Encounter for contraceptive management, unspecified: Secondary | ICD-10-CM | POA: Diagnosis not present

## 2023-09-26 LAB — POCT URINE PREGNANCY: Preg Test, Ur: NEGATIVE

## 2023-09-26 MED ORDER — SLYND 4 MG PO TABS: 1.0000 | ORAL_TABLET | Freq: Every day | ORAL | 11 refills | Status: AC

## 2023-09-26 NOTE — Progress Notes (Signed)
 Pt presents for [facility] consult. D&E 09-07-23 Interested in [facility] pills. Last PAP approx. Age 28.

## 2023-09-26 NOTE — Progress Notes (Signed)
 GYNECOLOGY OFFICE NOTE  History:  28 y.o. G1P0010 here today for post abortion care. She required D&E for incomplete abortion on 09/08/23. Stopped bleeding shortly thereafter. Pain well controlled.   She is getting dizzy occasionally, reports she has not been getting enough sleep and probably not drinking enough water. Also reports significant fatigue. Wants birth control pills.   Started bleeding today, is not heavy, is worried if that is normal.   Past Medical History:  Diagnosis Date   Alopecia    Anxiety    Depression    Retained products of conception following abortion 09/06/2023   EAB with medications   Wears glasses     Past Surgical History:  Procedure Laterality Date   DILATION AND EVACUATION N/A 09/07/2023   Procedure: DILATION AND EVACUATION, UTERUS;  Surgeon: Eveline Lynwood MATSU, MD;  Location: [facility] OR;  Service: Gynecology;  Laterality: N/A;     Current Outpatient Medications:    acetaminophen  (TYLENOL ) 325 MG tablet, Take 650 mg by mouth every 6 (six) hours as needed., Disp: , Rfl:    Drospirenone (SLYND) 4 MG TABS, Take 1 tablet (4 mg total) by mouth daily., Disp: 28 tablet, Rfl: 11   ondansetron  (ZOFRAN ) 4 MG tablet, Take 1 tablet (4 mg total) by mouth every 4 (four) hours as needed for nausea or vomiting. (Patient not taking: Reported on 09/26/2023), Disp: 6 tablet, Rfl: 0   oxyCODONE  (ROXICODONE ) 5 MG immediate release tablet, Take 1 tablet (5 mg total) by mouth every 4 (four) hours as needed for severe pain (pain score 7-10). (Patient not taking: Reported on 09/26/2023), Disp: 10 tablet, Rfl: 0  The following portions of the patient's history were reviewed and updated as appropriate: allergies, current medications, past family history, past medical history, past social history, past surgical history and problem list.   Review of Systems:  Pertinent items noted in HPI and remainder of comprehensive ROS otherwise negative.   Objective:  Physical Exam BP 112/73    Pulse 65   Ht 4' 9 (1.448 m)   Wt 129 lb (58.5 kg)   Breastfeeding No   BMI 27.92 kg/m  CONSTITUTIONAL: Well-developed, well-nourished female in no acute distress.  HENT:  Normocephalic, atraumatic. External right and left ear normal. Oropharynx is clear and moist EYES: Conjunctivae and EOM are normal. Pupils are equal, round, and reactive to light. No scleral icterus.  NECK: Normal range of motion, supple, no masses SKIN: Skin is warm and dry. No rash noted. Not diaphoretic. No erythema. No pallor. NEUROLOGIC: Alert and oriented to person, place, and time. Normal reflexes, muscle tone coordination. No cranial nerve deficit noted. PSYCHIATRIC: Normal mood and affect. Normal behavior. Normal judgment and thought content. CARDIOVASCULAR: Normal heart rate noted RESPIRATORY: Effort normal, no problems with respiration noted ABDOMEN: Soft, no distention noted.   PELVIC: deferred MUSCULOSKELETAL: Normal range of motion. No edema noted.  Labs and Imaging   Assessment & Plan:  1. Encounter for contraceptive management, unspecified type (Primary) Reviewed options for birth control, patient desires oral contraceptive pills (combination and progesterone only) Thoroughly reviewed risks/benefits/side effects of each. Answered all questions. Patient opts for POP Rx sent to pharmacy. - POCT urine pregnancy  2. Postoperative state Doing well Reassured her bleeding is likely period - CBC  3. Other fatigue Encouraged her to eat, sleep and drink more water  Routine preventative health maintenance measures emphasized. Please refer to After Visit Summary for other counseling recommendations.   Return in about 3 months (around 12/27/2023) for  prn.   LOIS Yolanda Moats, MD, [facility] Attending Center for [facility])

## 2023-09-27 ENCOUNTER — Ambulatory Visit: Admitting: Obstetrics & Gynecology

## 2023-09-27 ENCOUNTER — Ambulatory Visit: Payer: Self-pay | Admitting: Obstetrics and Gynecology

## 2023-09-27 LAB — CBC
Hematocrit: 40.1 % (ref 34.0–46.6)
Hemoglobin: 12.9 g/dL (ref 11.1–15.9)
MCH: 30.6 pg (ref 26.6–33.0)
MCHC: 32.2 g/dL (ref 31.5–35.7)
MCV: 95 fL (ref 79–97)
Platelets: 418 10*3/uL (ref 150–450)
RBC: 4.21 x10E6/uL (ref 3.77–5.28)
RDW: 12.3 % (ref 11.7–15.4)
WBC: 9.8 10*3/uL (ref 3.4–10.8)

## 2023-10-03 NOTE — Progress Notes (Signed)
 Elective abortion, with retained products of conception

## 2023-12-09 ENCOUNTER — Ambulatory Visit
Admission: EM | Admit: 2023-12-09 | Discharge: 2023-12-09 | Disposition: A | Discharge status: Home / Self Care | Payer: Self-pay | Attending: Internal Medicine | Admitting: Internal Medicine

## 2023-12-09 ENCOUNTER — Encounter: Payer: Self-pay | Admitting: Emergency Medicine

## 2023-12-09 DIAGNOSIS — J069 Acute upper respiratory infection, unspecified: Secondary | ICD-10-CM

## 2023-12-09 DIAGNOSIS — Z3202 Encounter for pregnancy test, result negative: Secondary | ICD-10-CM

## 2023-12-09 LAB — POC COVID19/FLU A&B COMBO
Covid Antigen, POC: NEGATIVE
Influenza A Antigen, POC: NEGATIVE
Influenza B Antigen, POC: NEGATIVE

## 2023-12-09 LAB — POCT URINE PREGNANCY: Preg Test, Ur: NEGATIVE

## 2023-12-09 MED ORDER — PROMETHAZINE-DM 6.25-15 MG/5ML PO SYRP: 5.0000 mL | ORAL_SOLUTION | Freq: Every evening | ORAL | 0 refills | Status: AC | PRN

## 2023-12-09 NOTE — Discharge Instructions (Signed)

## 2023-12-09 NOTE — ED Triage Notes (Addendum)
 Pt c/o felling warm, congestion, chills, body aches, hunger, thirst for 3 days.  At covid test was negative this morning.   Pt states she bleed some earlier this week but it wasn't much. Pt is concerned about pregnancy and would like to be tested.

## 2023-12-09 NOTE — ED Provider Notes (Signed)
 [facility]    CSN: [id number] Arrival date & time: 12/09/23  1104      History   Chief Complaint Chief Complaint  Patient presents with   Fever    HPI Yvonne Carlson is a 28 y.o. female.   Yvonne Carlson is a 28 y.o. female presenting for chief complaint of Fever, chills, cough, sore throat, and congestion that started 3 days ago. Cough is minimally productive. Sore throat is worsened by coughing. Denies shortness of breath, chest pain, nausea, vomiting, diarrhea, abdominal pain, and rashes. She has felt hot/cold chills without documented fever at home. Former smoker, denies current tobacco product use. Denies recent sick contacts with similar symptoms.   Additionally, she would like pregnancy testing. She had a light menstrual cycle starting 3-4 days ago but would like to make sure it was her menstrual and not bleeding in pregnancy since it was lighter than normal.    Fever   Past Medical History:  Diagnosis Date   Alopecia    Anxiety    Depression    Retained products of conception following abortion 09/06/2023   EAB with medications   Wears glasses     There are no active problems to display for this patient.   Past Surgical History:  Procedure Laterality Date   DILATION AND EVACUATION N/A 09/07/2023   Procedure: DILATION AND EVACUATION, UTERUS;  Surgeon: Eveline Lynwood MATSU, MD;  Location: [facility] OR;  Service: Gynecology;  Laterality: N/A;    OB History     Gravida  1   Para  0   Term  0   Preterm  0   AB  1   Living  0      SAB  0   IAB  1   Ectopic  0   Multiple  0   Live Births               Home Medications    Prior to Admission medications   Medication Sig Start Date End Date Taking? Authorizing Provider  promethazine -dextromethorphan (PROMETHAZINE -DM) 6.25-15 MG/5ML syrup Take 5 mLs by mouth at bedtime as needed for cough. 12/09/23  Yes Enedelia Dorna HERO, FNP  acetaminophen  (TYLENOL ) 325 MG tablet Take 650 mg by mouth  every 6 (six) hours as needed.    [provider]  Drospirenone  (SLYND ) 4 MG TABS Take 1 tablet (4 mg total) by mouth daily. 09/26/23   Nicholaus Burnard HERO, MD  ondansetron  (ZOFRAN ) 4 MG tablet Take 1 tablet (4 mg total) by mouth every 4 (four) hours as needed for nausea or vomiting. Patient not taking: Reported on 09/26/2023 09/05/23   Elnor Savant A, DO  oxyCODONE  (ROXICODONE ) 5 MG immediate release tablet Take 1 tablet (5 mg total) by mouth every 4 (four) hours as needed for severe pain (pain score 7-10). Patient not taking: Reported on 09/26/2023 09/05/23   Elnor Savant LABOR, DO    Family History Family History  Problem Relation Age of Onset   Hypertension Maternal Grandmother    Diabetes Maternal Grandmother     Social History Social History   Tobacco Use   Smoking status: Never   Smokeless tobacco: Never  Vaping Use   Vaping status: Never Used  Substance Use Topics   Alcohol use: Not Currently   Drug use: Never     Allergies   Patient has no known allergies.   Review of Systems Review of Systems  Constitutional:  Positive for fever.  Per HPI  Physical Exam Triage Vital Signs ED Triage Vitals  Encounter Vitals Group     BP 12/09/23 1142 127/82     Girls Systolic BP Percentile --      Girls Diastolic BP Percentile --      Boys Systolic BP Percentile --      Boys Diastolic BP Percentile --      Pulse Rate 12/09/23 1142 65     Resp 12/09/23 1142 17     Temp 12/09/23 1142 98.7 F (37.1 C)     Temp Source 12/09/23 1142 Oral     SpO2 12/09/23 1142 97 %     Weight --      Height --      Head Circumference --      Peak Flow --      Pain Score 12/09/23 1144 4     Pain Loc --      Pain Education --      Exclude from Growth Chart --    No data found.  Updated Vital Signs BP 127/82 (BP Location: Right Arm)   Pulse 65   Temp 98.7 F (37.1 C) (Oral)   Resp 17   LMP 12/05/2023 (Exact Date)   SpO2 97%   Visual Acuity Right Eye Distance:   Left Eye Distance:    Bilateral Distance:    Right Eye Near:   Left Eye Near:    Bilateral Near:     Physical Exam Vitals and nursing note reviewed.  Constitutional:      Appearance: She is not ill-appearing or toxic-appearing.  HENT:     Head: Normocephalic and atraumatic.     Right Ear: Hearing, tympanic membrane, ear canal and external ear normal.     Left Ear: Hearing, tympanic membrane, ear canal and external ear normal.     Nose: Congestion present.     Mouth/Throat:     Lips: Pink.     Mouth: Mucous membranes are moist. No injury or oral lesions.     Dentition: Normal dentition.     Tongue: No lesions.     Pharynx: Oropharynx is clear. Uvula midline. No pharyngeal swelling, oropharyngeal exudate, posterior oropharyngeal erythema, uvula swelling or postnasal drip.     Tonsils: No tonsillar exudate.  Eyes:     General: Lids are normal. Vision grossly intact. Gaze aligned appropriately.     Extraocular Movements: Extraocular movements intact.     Conjunctiva/sclera: Conjunctivae normal.  Neck:     Trachea: Trachea and phonation normal.  Cardiovascular:     Rate and Rhythm: Normal rate and regular rhythm.     Heart sounds: Normal heart sounds, S1 normal and S2 normal.  Pulmonary:     Effort: Pulmonary effort is normal. No respiratory distress.     Breath sounds: Normal breath sounds and air entry.  Musculoskeletal:     Cervical back: Neck supple.  Lymphadenopathy:     Cervical: No cervical adenopathy.  Skin:    General: Skin is warm and dry.     Capillary Refill: Capillary refill takes less than 2 seconds.     Findings: No rash.  Neurological:     General: No focal deficit present.     Mental Status: She is alert and oriented to person, place, and time. Mental status is at baseline.     Cranial Nerves: No dysarthria or facial asymmetry.  Psychiatric:        Mood and Affect: Mood normal.        Speech:  Speech normal.        Behavior: Behavior normal.        Thought Content: Thought  content normal.        Judgment: Judgment normal.      UC Treatments / Results  Labs (all labs ordered are listed, but only abnormal results are displayed) Labs Reviewed  POCT URINE PREGNANCY  POC COVID19/FLU A&B COMBO    EKG   Radiology No results found.  Procedures Procedures (including critical care time)  Medications Ordered in UC Medications - No data to display  Initial Impression / Assessment and Plan / UC Course  I have reviewed the triage vital signs and the nursing notes.  Pertinent labs & imaging results that were available during my care of the patient were reviewed by me and considered in my medical decision making (see chart for details).   1. Viral URI with cough, negative pregnancy test Suspect viral URI, viral syndrome.  Strep/viral testing: POC COVID-19 and influenza testing negative  Physical exam findings reassuring, vital signs hemodynamically stable, and lungs clear, therefore deferred imaging of the chest.  Advised supportive care/prescriptions for symptomatic relief as outlined in AVS.    Urine pregnancy test is negative.  Counseled patient on potential for adverse effects with medications prescribed/recommended today, strict ER and return-to-clinic precautions discussed, patient verbalized understanding.    Final Clinical Impressions(s) / UC Diagnoses   Final diagnoses:  Viral URI with cough  Urine pregnancy test negative     Discharge Instructions      You have a viral illness which will improve on its own with rest, fluids, and medications to help with your symptoms.  Tylenol , guaifenesin (plain mucinex), and saline nasal sprays may help relieve symptoms.   Two teaspoons of honey in 1 cup of warm water every 4-6 hours may help with throat pains.  Humidifier in room at nighttime may help soothe cough (clean well daily).   Take Promethazine  DM cough medication to help with your cough at nighttime so that you are able to sleep. Do  not drive, drink alcohol, or go to work while taking this medication since it can make you sleepy. Only take this at nighttime.   For chest pain, shortness of breath, inability to keep food or fluids down without vomiting, fever that does not respond to tylenol  or motrin , or any other severe symptoms, please go to the ER for further evaluation. Return to urgent care as needed, otherwise follow-up with PCP.      ED Prescriptions     Medication Sig Dispense Auth. Provider   promethazine -dextromethorphan (PROMETHAZINE -DM) 6.25-15 MG/5ML syrup Take 5 mLs by mouth at bedtime as needed for cough. 118 mL Enedelia Dorna HERO, FNP      PDMP not reviewed this encounter.   Enedelia Dorna HERO, [place] 12/09/23 2005

## 2023-12-26 ENCOUNTER — Ambulatory Visit
Admission: EM | Admit: 2023-12-26 | Discharge: 2023-12-26 | Disposition: A | Discharge status: Home / Self Care | Payer: Self-pay | Attending: Physician Assistant | Admitting: Physician Assistant

## 2023-12-26 ENCOUNTER — Other Ambulatory Visit: Payer: Self-pay

## 2023-12-26 DIAGNOSIS — R0989 Other specified symptoms and signs involving the circulatory and respiratory systems: Secondary | ICD-10-CM

## 2023-12-26 DIAGNOSIS — M546 Pain in thoracic spine: Secondary | ICD-10-CM

## 2023-12-26 DIAGNOSIS — U071 COVID-19: Secondary | ICD-10-CM

## 2023-12-26 LAB — POCT URINE DIPSTICK
Bilirubin, UA: NEGATIVE
Blood, UA: NEGATIVE
Glucose, UA: NEGATIVE mg/dL
Ketones, POC UA: NEGATIVE mg/dL
Leukocytes, UA: NEGATIVE
Nitrite, UA: NEGATIVE
Protein Ur, POC: NEGATIVE mg/dL
Spec Grav, UA: 1.025 (ref 1.010–1.025)
Urobilinogen, UA: 0.2 U/dL
pH, UA: 7.5 (ref 5.0–8.0)

## 2023-12-26 LAB — POC SOFIA SARS ANTIGEN FIA: SARS Coronavirus 2 Ag: POSITIVE — AB

## 2023-12-26 LAB — POCT RAPID STREP A (OFFICE): Rapid Strep A Screen: NEGATIVE

## 2023-12-26 LAB — POCT URINE PREGNANCY: Preg Test, Ur: NEGATIVE

## 2023-12-26 MED ORDER — IBUPROFEN 800 MG PO TABS
800.0000 mg | ORAL_TABLET | Freq: Once | ORAL | Status: AC
  Administered 2023-12-26: 800 mg via ORAL

## 2023-12-26 NOTE — ED Triage Notes (Addendum)
 Pt presents to urgent care with a chief complaint of severe back pain upon wakening today. Currently rates overall back pain a 9/10. Describes as sharp and aching. This is accompanied with stomach cramping, chills, diarrhea, nausea, breast tenderness, and cough. Pt noted the following symptoms yesterday. Pt does mention two menstrual periods this month. Very abnormal for her. Tylenol  taken at home with minimal relief.

## 2023-12-26 NOTE — Discharge Instructions (Addendum)
  You were seen today for concerns of nasal congestion, runny nose, body aches, back pain. Your testing was positive for COVID-19. To help manage your symptoms I recommend over-the-counter medications such as the following:  DayQuil/NyQuil Alka-Seltzer day and night TheraFlu day and night  *If you have High blood pressure I recommend taking Mucinex, Robitussin, and Acetaminophen  rather than the combination medications. Flonase nasal spray  Nasal saline flushes/rinses Humidifier at night Warm tea with honey Ibuprofen  as needed  Please make sure that you are getting plenty of rest and staying well-hydrated If you develop any of the following symptoms please go to the emergency room: Significant difficulty breathing, chest pain, fevers that are not responding to acetaminophen  or ibuprofen , swelling of one of your arms or legs, inability to eat or drink leading to dehydration, confusion, loss of consciousness.

## 2023-12-26 NOTE — ED Provider Notes (Signed)
 [facility]    CSN: [id number] Arrival date & time: 12/26/23  1349      History   Chief Complaint Chief Complaint  Patient presents with   Back Pain    HPI Yvonne Carlson is a 28 y.o. female.  has a past medical history of Alopecia, Anxiety, Depression, Retained products of conception following abortion (09/06/2023), and Wears glasses.   HPI  Discussed the use of AI scribe software for clinical note transcription with the patient, who gave verbal consent to proceed.  The patient presents with back pain and flu-like symptoms. She is accompanied by a her boyfriend  She experiences back pain localized along her spine, describing it as affecting the entire spine and currently hurting. She denies recent heavy lifting or twisting but mentions performing ab workouts at the gym two days in a row. She distinguishes this pain from typical workout pain, describing it as a 'different type of pain.'  In addition to back pain, she has flu-like symptoms including chills, nasal congestion, and a cough that started today. She mentions having had a virus about three weeks ago and a slow recovery beginning a week and a half ago. She experiences occasional fever, particularly at night, but is unsure of the exact temperature. She also reports feeling achy and having a sore throat yesterday, which has since improved.  She experiences nausea and stomach cramps, describing the pain as 'cramping real bad' around her belly button. No vaginal bleeding, discharge, or pain. She has not taken any medication for these symptoms today but took Tylenol  earlier in the morning for pain relief, which helped temporarily.  No shortness of breath or recent travel. She felt dehydrated last night and took water to alleviate it. She has not been in contact with anyone who has been sick recently.   Past Medical History:  Diagnosis Date   Alopecia    Anxiety    Depression    Retained products of conception  following abortion 09/06/2023   EAB with medications   Wears glasses     There are no active problems to display for this patient.   Past Surgical History:  Procedure Laterality Date   DILATION AND EVACUATION N/A 09/07/2023   Procedure: DILATION AND EVACUATION, UTERUS;  Surgeon: Eveline Lynwood MATSU, MD;  Location: [facility] OR;  Service: Gynecology;  Laterality: N/A;    OB History     Gravida  1   Para  0   Term  0   Preterm  0   AB  1   Living  0      SAB  0   IAB  1   Ectopic  0   Multiple  0   Live Births               Home Medications    Prior to Admission medications   Medication Sig Start Date End Date Taking? Authorizing Provider  acetaminophen  (TYLENOL ) 325 MG tablet Take 650 mg by mouth every 6 (six) hours as needed.    [provider]  Drospirenone  (SLYND ) 4 MG TABS Take 1 tablet (4 mg total) by mouth daily. 09/26/23   Nicholaus Burnard HERO, MD  ondansetron  (ZOFRAN ) 4 MG tablet Take 1 tablet (4 mg total) by mouth every 4 (four) hours as needed for nausea or vomiting. Patient not taking: Reported on 09/26/2023 09/05/23   Elnor Savant A, DO  oxyCODONE  (ROXICODONE ) 5 MG immediate release tablet Take 1 tablet (5 mg total) by mouth  every 4 (four) hours as needed for severe pain (pain score 7-10). Patient not taking: Reported on 09/26/2023 09/05/23   Elnor Savant A, DO  promethazine -dextromethorphan (PROMETHAZINE -DM) 6.25-15 MG/5ML syrup Take 5 mLs by mouth at bedtime as needed for cough. 12/09/23   Enedelia Dorna HERO, FNP    Family History Family History  Problem Relation Age of Onset   Hypertension Maternal Grandmother    Diabetes Maternal Grandmother     Social History Social History   Tobacco Use   Smoking status: Never   Smokeless tobacco: Never  Vaping Use   Vaping status: Never Used  Substance Use Topics   Alcohol use: Not Currently   Drug use: Never     Allergies   Patient has no known allergies.   Review of Systems Review of Systems   Constitutional:  Positive for chills. Negative for fever.  HENT:  Positive for congestion, ear pain, rhinorrhea and sore throat.   Respiratory:  Positive for cough. Negative for shortness of breath and wheezing.   Gastrointestinal:  Positive for abdominal pain and nausea. Negative for diarrhea and vomiting.  Genitourinary:  Negative for dysuria, flank pain, vaginal bleeding, vaginal discharge and vaginal pain.  Musculoskeletal:  Positive for back pain and myalgias.     Physical Exam Triage Vital Signs ED Triage Vitals  Encounter Vitals Group     BP 12/26/23 1403 133/77     Girls Systolic BP Percentile --      Girls Diastolic BP Percentile --      Boys Systolic BP Percentile --      Boys Diastolic BP Percentile --      Pulse Rate 12/26/23 1403 95     Resp 12/26/23 1403 18     Temp 12/26/23 1403 (!) 100.8 F (38.2 C)     Temp Source 12/26/23 1403 Oral     SpO2 12/26/23 1403 98 %     Weight 12/26/23 1403 105 lb (47.6 kg)     Height 12/26/23 1403 4' 9 (1.448 m)     Head Circumference --      Peak Flow --      Pain Score 12/26/23 1418 9     Pain Loc --      Pain Education --      Exclude from Growth Chart --    No data found.  Updated Vital Signs BP 113/72 (BP Location: Right Arm)   Pulse 99   Temp (!) 100.7 F (38.2 C) (Oral)   Resp 18   Ht 4' 9 (1.448 m)   Wt 105 lb (47.6 kg)   LMP 12/16/2023 (Exact Date)   SpO2 98%   BMI 22.72 kg/m   Visual Acuity Right Eye Distance:   Left Eye Distance:   Bilateral Distance:    Right Eye Near:   Left Eye Near:    Bilateral Near:     Physical Exam Vitals reviewed.  Constitutional:      General: She is awake.     Appearance: Normal appearance. She is well-developed and well-groomed.  HENT:     Head: Normocephalic and atraumatic.     Right Ear: Hearing, tympanic membrane and ear canal normal.     Left Ear: Hearing, tympanic membrane and ear canal normal.     Mouth/Throat:     Lips: Pink.     Mouth: Mucous membranes  are moist.     Pharynx: Oropharynx is clear. Uvula midline. No pharyngeal swelling, oropharyngeal exudate, posterior oropharyngeal erythema, uvula swelling or  postnasal drip.  Cardiovascular:     Rate and Rhythm: Normal rate and regular rhythm.     Pulses: Normal pulses.          Radial pulses are 2+ on the right side and 2+ on the left side.     Heart sounds: Normal heart sounds. No murmur heard.    No friction rub. No gallop.  Pulmonary:     Effort: Pulmonary effort is normal.     Breath sounds: Normal breath sounds. No decreased air movement. No decreased breath sounds, wheezing, rhonchi or rales.  Abdominal:     General: Abdomen is flat. Bowel sounds are normal.     Palpations: Abdomen is soft.     Tenderness: There is generalized abdominal tenderness.  Musculoskeletal:     Cervical back: Normal range of motion and neck supple.     Comments: No obvious step-offs or abnormalities to palpation of cervical, thoracic, lumbar spines.  Lymphadenopathy:     Head:     Right side of head: No submental, submandibular or preauricular adenopathy.     Left side of head: No submental, submandibular or preauricular adenopathy.     Cervical:     Right cervical: No superficial cervical adenopathy.    Left cervical: No superficial cervical adenopathy.     Upper Body:     Right upper body: No supraclavicular adenopathy.     Left upper body: No supraclavicular adenopathy.  Skin:    General: Skin is warm and dry.  Neurological:     General: No focal deficit present.     Mental Status: She is alert and oriented to person, place, and time.  Psychiatric:        Mood and Affect: Mood normal.        Behavior: Behavior normal. Behavior is cooperative.        Thought Content: Thought content normal.        Judgment: Judgment normal.      UC Treatments / Results  Labs (all labs ordered are listed, but only abnormal results are displayed) Labs Reviewed  POCT URINE DIPSTICK - Abnormal; Notable for  the following components:      Result Value   Color, UA light yellow (*)    All other components within normal limits  POC SOFIA SARS ANTIGEN FIA - Abnormal; Notable for the following components:   SARS Coronavirus 2 Ag Positive (*)    All other components within normal limits  POCT URINE PREGNANCY - Normal  POCT RAPID STREP A (OFFICE) - Normal    EKG   Radiology No results found.  Procedures Procedures (including critical care time)  Medications Ordered in UC Medications  ibuprofen  (ADVIL ) tablet 800 mg (800 mg Oral Given 12/26/23 1523)    Initial Impression / Assessment and Plan / UC Course  I have reviewed the triage vital signs and the nursing notes.  Pertinent labs & imaging results that were available during my care of the patient were reviewed by me and considered in my medical decision making (see chart for details).      Final Clinical Impressions(s) / UC Diagnoses   Final diagnoses:  Symptoms of upper respiratory infection (URI)  Acute midline thoracic back pain  COVID-19   Acute viral upper respiratory infection with fever and chills Presents with symptoms consistent with an acute viral upper respiratory infection, including nasal congestion, cough, sore throat, and chills. Reports intermittent fever, particularly at night, but exact temperature not documented. Recent viral illness  three weeks ago with slow recovery. Differential includes COVID-19 , viral URI, strep.  - Perform COVID-19, and strep throat tests- COVID positive, strep negative. Results discussed with patient during apt.  -Recommend OTC medications for symptomatic relief.  - Advise supportive treatment with alternating acetaminophen  and ibuprofen  for pain and fever - Recommend Robitussin or Mucinex for cough and congestion - Instruct to seek emergency care if experiencing severe symptoms such as difficulty breathing, persistent high fever, or dehydration  Nausea and abdominal pain Reports  nausea and abdominal cramping around the umbilical region. Symptoms may be related to viral illness or recent physical activity. No diarrhea reported.suspect this is secondary to COVID infection.  - Advise supportive care and monitor symptoms  Acute back pain Reports acute back pain localized to the spine, possibly related to recent gym activity. Describes pain as different from typical muscle soreness. Recent activity at the gym, including ab workout noted. Pain may be exacerbated by viral illness or muscle strain, or both cumulatively causing discomfort. - Advise rest and avoid strenuous activity - Recommend alternating acetaminophen  and ibuprofen  for pain management -Warm compresses to the area and massage as tolerated. Will provide home stretches to further assist with discomfort.     Discharge Instructions       You were seen today for concerns of nasal congestion, runny nose, body aches, back pain. Your testing was positive for COVID-19. To help manage your symptoms I recommend over-the-counter medications such as the following:  DayQuil/NyQuil Alka-Seltzer day and night TheraFlu day and night  *If you have High blood pressure I recommend taking Mucinex, Robitussin, and Acetaminophen  rather than the combination medications. Flonase nasal spray  Nasal saline flushes/rinses Humidifier at night Warm tea with honey Ibuprofen  as needed  Please make sure that you are getting plenty of rest and staying well-hydrated If you develop any of the following symptoms please go to the emergency room: Significant difficulty breathing, chest pain, fevers that are not responding to acetaminophen  or ibuprofen , swelling of one of your arms or legs, inability to eat or drink leading to dehydration, confusion, loss of consciousness.      ED Prescriptions   None    PDMP not reviewed this encounter.   Marylene Rocky BRAVO, PA-C 12/26/23 1607

## 2024-03-01 ENCOUNTER — Encounter: Payer: Self-pay | Admitting: Emergency Medicine

## 2024-03-01 ENCOUNTER — Ambulatory Visit
Admission: EM | Admit: 2024-03-01 | Discharge: 2024-03-01 | Disposition: A | Discharge status: Home / Self Care | Payer: Self-pay | Attending: Emergency Medicine | Admitting: Emergency Medicine

## 2024-03-01 DIAGNOSIS — R079 Chest pain, unspecified: Secondary | ICD-10-CM

## 2024-03-01 MED ORDER — FAMOTIDINE 20 MG PO TABS: 20.0000 mg | ORAL_TABLET | Freq: Two times a day (BID) | ORAL | 0 refills | Status: AC

## 2024-03-01 MED ORDER — ALUM & MAG HYDROXIDE-SIMETH 200-200-20 MG/5ML PO SUSP
30.0000 mL | Freq: Once | ORAL | Status: AC
  Administered 2024-03-01: 30 mL via ORAL

## 2024-03-01 MED ORDER — LIDOCAINE VISCOUS HCL 2 % MT SOLN
15.0000 mL | Freq: Once | OROMUCOSAL | Status: AC
  Administered 2024-03-01: 15 mL via OROMUCOSAL

## 2024-03-01 MED ORDER — KETOROLAC TROMETHAMINE 30 MG/ML IJ SOLN
30.0000 mg | Freq: Once | INTRAMUSCULAR | Status: AC
  Administered 2024-03-01: 30 mg via INTRAMUSCULAR

## 2024-03-01 NOTE — ED Provider Notes (Signed)
 [facility]    CSN: [id number] Arrival date & time: 03/01/24  1020      History   Chief Complaint Chief Complaint  Patient presents with   Chest Pain   Shortness of Breath    HPI Yvonne Carlson is a 28 y.o. female.   Pt states she woke up this morning with sharp chest pain. She describes pain as a burning pain that she sometimes feels in her back. She is not SOB but states chest pain is worse with deep breath. Also denies coughing, wheezing, fever, or feeling sick.    Pt states she has been burping more often   She thinks she may have eaten too fast last night (lasagne) right before she went to bed. She has had heartburn before and thinks maybe she is having acid reflux. Took tylenol  and it helped her sx slightly; drinking water also helped relieve her sx somewhat. Has not taken tums or similar today, but has used tums in the past for relief of similar sx.    Chest Pain Associated symptoms: shortness of breath   Shortness of Breath Associated symptoms: chest pain     Past Medical History:  Diagnosis Date   Alopecia    Anxiety    Depression    Retained products of conception following abortion 09/06/2023   EAB with medications   Wears glasses     There are no active problems to display for this patient.   Past Surgical History:  Procedure Laterality Date   DILATION AND EVACUATION N/A 09/07/2023   Procedure: DILATION AND EVACUATION, UTERUS;  Surgeon: Eveline Lynwood MATSU, MD;  Location: [facility] OR;  Service: Gynecology;  Laterality: N/A;    OB History     Gravida  1   Para  0   Term  0   Preterm  0   AB  1   Living  0      SAB  0   IAB  1   Ectopic  0   Multiple  0   Live Births               Home Medications    Prior to Admission medications   Medication Sig Start Date End Date Taking? Authorizing Provider  famotidine  (PEPCID ) 20 MG tablet Take 1 tablet (20 mg total) by mouth 2 (two) times daily. 03/01/24  Yes Richad Jon HERO, NP   acetaminophen  (TYLENOL ) 325 MG tablet Take 650 mg by mouth every 6 (six) hours as needed.    [provider]  Drospirenone  (SLYND ) 4 MG TABS Take 1 tablet (4 mg total) by mouth daily. 09/26/23   Nicholaus Burnard HERO, MD  ondansetron  (ZOFRAN ) 4 MG tablet Take 1 tablet (4 mg total) by mouth every 4 (four) hours as needed for nausea or vomiting. Patient not taking: Reported on 09/26/2023 09/05/23   Elnor Savant A, DO  oxyCODONE  (ROXICODONE ) 5 MG immediate release tablet Take 1 tablet (5 mg total) by mouth every 4 (four) hours as needed for severe pain (pain score 7-10). Patient not taking: Reported on 09/26/2023 09/05/23   Elnor Savant A, DO  promethazine -dextromethorphan (PROMETHAZINE -DM) 6.25-15 MG/5ML syrup Take 5 mLs by mouth at bedtime as needed for cough. 12/09/23   Enedelia Dorna HERO, FNP    Family History Family History  Problem Relation Age of Onset   Hypertension Maternal Grandmother    Diabetes Maternal Grandmother     Social History Social History   Tobacco Use   Smoking  status: Never   Smokeless tobacco: Never  Vaping Use   Vaping status: Never Used  Substance Use Topics   Alcohol use: Not Currently   Drug use: Never     Allergies   Patient has no known allergies.   Review of Systems Review of Systems  Respiratory:  Positive for shortness of breath.   Cardiovascular:  Positive for chest pain.     Physical Exam Triage Vital Signs ED Triage Vitals  Encounter Vitals Group     BP 03/01/24 1028 123/83     Girls Systolic BP Percentile --      Girls Diastolic BP Percentile --      Boys Systolic BP Percentile --      Boys Diastolic BP Percentile --      Pulse Rate 03/01/24 1028 85     Resp 03/01/24 1028 17     Temp 03/01/24 1028 98 F (36.7 C)     Temp Source 03/01/24 1026 Oral     SpO2 03/01/24 1028 97 %     Weight --      Height --      Head Circumference --      Peak Flow --      Pain Score 03/01/24 1028 6     Pain Loc --      Pain Education --       Exclude from Growth Chart --    No data found.  Updated Vital Signs BP 123/83 (BP Location: Right Arm)   Pulse 85   Temp 98 F (36.7 C) (Oral)   Resp 17   LMP 02/08/2024 (Approximate)   SpO2 97%   Visual Acuity Right Eye Distance:   Left Eye Distance:   Bilateral Distance:    Right Eye Near:   Left Eye Near:    Bilateral Near:     Physical Exam Constitutional:      General: She is not in acute distress.    Appearance: She is well-developed. She is not ill-appearing.  Cardiovascular:     Rate and Rhythm: Normal rate and regular rhythm.  Pulmonary:     Effort: Pulmonary effort is normal.     Breath sounds: Normal breath sounds.  Chest:     Chest wall: No tenderness.    Abdominal:     General: Bowel sounds are normal.     Palpations: Abdomen is soft.     Tenderness: There is no abdominal tenderness.  Neurological:     Mental Status: She is alert.      UC Treatments / Results  Labs (all labs ordered are listed, but only abnormal results are displayed) Labs Reviewed - No data to display  EKG EKG: normal EKG, normal sinus rhythm, Q waves in III.     Radiology No results found.  Procedures Procedures (including critical care time)  Medications Ordered in UC Medications  alum & mag hydroxide-simeth (MAALOX/MYLANTA) 200-200-20 MG/5ML suspension 30 mL (30 mLs Oral Given 03/01/24 1106)  lidocaine  (XYLOCAINE ) 2 % viscous mouth solution 15 mL (15 mLs Mouth/Throat Given 03/01/24 1106)  ketorolac  (TORADOL ) 30 MG/ML injection 30 mg (30 mg Intramuscular Given 03/01/24 1132)    Initial Impression / Assessment and Plan / UC Course  I have reviewed the triage vital signs and the nursing notes.  Pertinent labs & imaging results that were available during my care of the patient were reviewed by me and considered in my medical decision making (see chart for details).     Sx improved  a lot with GI cocktail of maalox and lidocaine  but still has some middle of chest pain  and is burping more. She asks for more tylenol  but she took some at home about 3 hours ago so cannot have another dose. I do not want to offer ibuprofen  given I think she is having GERD sx; I offered toradol  and she agreed.   Will do a trial of H2 blocker for 2 weeks. Discussed foods to avoid with GERD, discussed not to lie down for 2 hours after eating. If sx do not improve/resolve, she will need to return here or see a pcp.    Final Clinical Impressions(s) / UC Diagnoses   Final diagnoses:  Chest pain, unspecified type     Discharge Instructions      Try the acid reflux medicine for 2 weeks and see if this stops your symptoms from occurring. Also review the attached handout on food choices when you have acid reflux and avoid foods that worsen acid reflux.   Finally, make sure to stay up (do not lie down) for 2 hours after eating a meal before lying down.      ED Prescriptions     Medication Sig Dispense Auth. Provider   famotidine  (PEPCID ) 20 MG tablet Take 1 tablet (20 mg total) by mouth 2 (two) times daily. 30 tablet Richad Jon HERO, NP      PDMP not reviewed this encounter.   Richad Jon HERO, NP 03/01/24 [phone]

## 2024-03-01 NOTE — ED Triage Notes (Addendum)
 Pt states she woke up this morning with sharp chest pain and sob. States chest pain is worse with deep breath.  Pt states she has been burping more often

## 2024-03-01 NOTE — Discharge Instructions (Signed)
 Try the acid reflux medicine for 2 weeks and see if this stops your symptoms from occurring. Also review the attached handout on food choices when you have acid reflux and avoid foods that worsen acid reflux.   Finally, make sure to stay up (do not lie down) for 2 hours after eating a meal before lying down.

## 2024-04-04 ENCOUNTER — Other Ambulatory Visit (HOSPITAL_COMMUNITY)
Admission: RE | Admit: 2024-04-04 | Discharge: 2024-04-04 | Disposition: A | Discharge status: Home / Self Care | Payer: Self-pay | Source: Ambulatory Visit | Attending: Family Medicine | Admitting: Family Medicine

## 2024-04-04 ENCOUNTER — Other Ambulatory Visit: Payer: Self-pay

## 2024-04-04 ENCOUNTER — Ambulatory Visit: Payer: Self-pay

## 2024-04-04 VITALS — BP 111/71 | HR 65 | Wt 132.0 lb

## 2024-04-04 DIAGNOSIS — Z3201 Encounter for pregnancy test, result positive: Secondary | ICD-10-CM

## 2024-04-04 DIAGNOSIS — O219 Vomiting of pregnancy, unspecified: Secondary | ICD-10-CM

## 2024-04-04 DIAGNOSIS — Z3A08 8 weeks gestation of pregnancy: Secondary | ICD-10-CM

## 2024-04-04 DIAGNOSIS — Z3401 Encounter for supervision of normal first pregnancy, first trimester: Secondary | ICD-10-CM | POA: Insufficient documentation

## 2024-04-04 LAB — POCT URINE PREGNANCY: Preg Test, Ur: POSITIVE — AB

## 2024-04-04 MED ORDER — PROMETHAZINE HCL 25 MG PO TABS: 25.0000 mg | ORAL_TABLET | Freq: Four times a day (QID) | ORAL | 2 refills | Status: AC | PRN

## 2024-04-04 NOTE — Addendum Note (Signed)
 Addended by: VENUS AMERICA A on: 04/04/2024 10:01 AM   Modules accepted: Orders

## 2024-04-04 NOTE — Progress Notes (Signed)
 New OB Intake  I explained I am completing New OB Intake today. We discussed EDD of 11/14/2024, by Last Menstrual Period. Pt is G2P0010. I reviewed her allergies, medications and Medical/Surgical/OB history.  Patient does have a history of depression and anxiety, not currently on medication.    Patient Active Problem List   Diagnosis Date Noted   Encounter for supervision of normal first pregnancy in first trimester 04/04/2024    Concerns addressed today  Patient informed that the ultrasound is considered a limited obstetric ultrasound and is not intended to be a complete ultrasound exam.  Patient also informed that the ultrasound is not being completed with the intent of assessing for fetal or placental anomalies or any pelvic abnormalities. Explained that the purpose of today's ultrasound is to assess for viability.  Patient acknowledges the purpose of the exam and the limitations of the study.     Delivery Plans Plans to deliver at [facility]. Discussed the nature of our practice with multiple providers including residents and students. Due to the size of the practice, the delivering provider may not be the same as those providing prenatal care.   MyChart/Babyscripts MyChart access verified. I explained pt will have some visits in office and some virtually. Babyscripts app discussed and ordered.   Blood Pressure Cuff Blood pressure cuff discussed.  Discussed to be used for virtual visits and or if needed BP checks weekly.  Anatomy US  Explained first scheduled US  will be around 19 weeks.   Last Pap No results found for: DIAGPAP  First visit review I reviewed new OB appt with patient. Explained pt will be seen by Harlene Duncans, CNM at first visit. Discussed [facility] genetic screening with patient. Routine prenatal labs ordered.    Erminio DELENA Rumps, [place] 04/04/2024  9:57 AM

## 2024-04-05 LAB — CBC/D/PLT+RPR+RH+ABO+RUBIGG...
Antibody Screen: NEGATIVE
Basophils Absolute: 0.1 x10E3/uL (ref 0.0–0.2)
Basos: 1 %
EOS (ABSOLUTE): 0.4 x10E3/uL (ref 0.0–0.4)
Eos: 4 %
HCV Ab: NONREACTIVE
HIV Screen 4th Generation wRfx: NONREACTIVE
Hematocrit: 40.1 % (ref 34.0–46.6)
Hemoglobin: 12.8 g/dL (ref 11.1–15.9)
Hepatitis B Surface Ag: NEGATIVE
Immature Grans (Abs): 0 x10E3/uL (ref 0.0–0.1)
Immature Granulocytes: 0 %
Lymphocytes Absolute: 2.6 x10E3/uL (ref 0.7–3.1)
Lymphs: 25 %
MCH: 29.4 pg (ref 26.6–33.0)
MCHC: 31.9 g/dL (ref 31.5–35.7)
MCV: 92 fL (ref 79–97)
Monocytes Absolute: 1 x10E3/uL — ABNORMAL HIGH (ref 0.1–0.9)
Monocytes: 10 %
Neutrophils Absolute: 6.2 x10E3/uL (ref 1.4–7.0)
Neutrophils: 60 %
Platelets: 397 x10E3/uL (ref 150–450)
RBC: 4.35 x10E6/uL (ref 3.77–5.28)
RDW: 12.5 % (ref 11.7–15.4)
RPR Ser Ql: NONREACTIVE
Rh Factor: POSITIVE
Rubella Antibodies, IGG: 11.5 {index}
WBC: 10.3 x10E3/uL (ref 3.4–10.8)

## 2024-04-05 LAB — CERVICOVAGINAL ANCILLARY ONLY
Chlamydia: NEGATIVE
Comment: NEGATIVE
Comment: NORMAL
Neisseria Gonorrhea: NEGATIVE

## 2024-04-05 LAB — HCV INTERPRETATION

## 2024-04-06 LAB — URINE CULTURE, OB REFLEX

## 2024-04-06 LAB — CULTURE, OB URINE

## 2024-04-08 ENCOUNTER — Ambulatory Visit: Payer: Self-pay | Admitting: Family Medicine

## 2024-04-08 DIAGNOSIS — Z3401 Encounter for supervision of normal first pregnancy, first trimester: Secondary | ICD-10-CM

## 2024-04-11 ENCOUNTER — Ambulatory Visit: Payer: Self-pay | Admitting: Family Medicine

## 2024-04-11 ENCOUNTER — Other Ambulatory Visit: Payer: Self-pay | Admitting: Family Medicine

## 2024-04-11 ENCOUNTER — Ambulatory Visit (INDEPENDENT_AMBULATORY_CARE_PROVIDER_SITE_OTHER): Payer: Self-pay

## 2024-04-11 DIAGNOSIS — O219 Vomiting of pregnancy, unspecified: Secondary | ICD-10-CM

## 2024-04-11 DIAGNOSIS — Z3401 Encounter for supervision of normal first pregnancy, first trimester: Secondary | ICD-10-CM

## 2024-04-11 DIAGNOSIS — O283 Abnormal ultrasonic finding on antenatal screening of mother: Secondary | ICD-10-CM

## 2024-04-11 DIAGNOSIS — Z3A01 Less than 8 weeks gestation of pregnancy: Secondary | ICD-10-CM

## 2024-04-11 DIAGNOSIS — Z3A08 8 weeks gestation of pregnancy: Secondary | ICD-10-CM

## 2024-04-11 NOTE — Assessment & Plan Note (Signed)
 US  shows lives IUP, however this is a R sided set of features of unclear etiology. On close review of images (#1-6, image 130) there appears to be a dividing membrane. Suspect she has a resorbing second twin, unclear chorionicity at this early gestational age. Discussed with Dr. Ileana from MFM, will get NIPT around 10 weeks and have her have a repeat US  with MFM at 12 weeks.  Discussed with patient who is understandably concerned and wondering about prognosis. We discussed that given uncertainty in what we are seeing it is hard to give good guidance but we know for sure that she has a live IUP with a normal FHR and that this is a good sign.

## 2024-04-11 NOTE — Progress Notes (Signed)
 "  GYNECOLOGY OFFICE VISIT NOTE  History:   Yvonne Carlson is a 29 y.o. G2P0010 here today for US  results.  Patient has no complaints  Health Maintenance Due  Topic Date Due   HPV VACCINES (1 - 3-dose series) Never done   DTaP/Tdap/Td (1 - Tdap) Never done   Hepatitis B Vaccines 19-59 Average Risk (1 of 3 - 19+ 3-dose series) Never done   Cervical Cancer Screening (Pap smear)  Never done   Influenza Vaccine  Never done   COVID-19 Vaccine (1 - 2025-26 season) Never done    Past Medical History:  Diagnosis Date   Alopecia    Anxiety    Depression    Retained products of conception following abortion 09/06/2023   EAB with medications   Wears glasses     Past Surgical History:  Procedure Laterality Date   DILATION AND EVACUATION N/A 09/07/2023   Procedure: DILATION AND EVACUATION, UTERUS;  Surgeon: Yvonne Lynwood MATSU, MD;  Location: [facility] OR;  Service: Gynecology;  Laterality: N/A;    The following portions of the patient's history were reviewed and updated as appropriate: allergies, current medications, past family history, past medical history, past social history, past surgical history and problem list.   Health Maintenance:   Last pap: No Cervical Cancer Screening results to display.   Last mammogram:  N/a    Review of Systems:  Pertinent items noted in HPI and remainder of comprehensive ROS otherwise negative.  Physical Exam:  LMP 02/08/2024 (Approximate)   Physical Exam Constitutional:      General: She is not in acute distress.    Appearance: Normal appearance. She is not ill-appearing.  HENT:     Head: Atraumatic.  Eyes:     General: No scleral icterus.    Conjunctiva/sclera: Conjunctivae normal.  Pulmonary:     Effort: Pulmonary effort is normal.  Skin:    General: Skin is warm and dry.     Coloration: Skin is not jaundiced or pale.  Neurological:     Mental Status: She is alert.     Coordination: Coordination normal.  Psychiatric:         Mood and Affect: Mood normal.        Behavior: Behavior normal.      Labs and Imaging No results found for this or any previous visit (from the past week). No results found.    Assessment and Plan:   Problem List Items Addressed This Visit       Other   Abnormal pregnancy US  - Primary   US  shows lives IUP, however this is a R sided set of features of unclear etiology. On close review of images (#1-6, image 130) there appears to be a dividing membrane. Suspect she has a resorbing second twin, unclear chorionicity at this early gestational age. Discussed with Dr. Ileana from MFM, will get NIPT around 10 weeks and have her have a repeat US  with MFM at 12 weeks.  Discussed with patient who is understandably concerned and wondering about prognosis. We discussed that given uncertainty in what we are seeing it is hard to give good guidance but we know for sure that she has a live IUP with a normal FHR and that this is a good sign.       Relevant Orders   US  MFM OB COMPLETE LESS THAN 14 WEEKS    Routine preventative health maintenance measures emphasized. Please refer to After Visit Summary for other counseling recommendations.   Return  in about 3 weeks (around 05/02/2024) for genetic testing blood draw.    Total face-to-face time with patient: 10 minutes.  Over 50% of encounter was spent on counseling and coordination of care.   Yvonne CHRISTELLA Carolus, MD/MPH Attending Family Medicine Physician, [facility], [facility] "

## 2024-05-14 ENCOUNTER — Encounter: Payer: Self-pay | Admitting: Physician Assistant
